# Patient Record
Sex: Female | Born: 1945 | Race: White | Hispanic: No | Marital: Married | Smoking: Never smoker
Health system: Southern US, Community
[De-identification: ages and names within clinical notes are randomized; demographics above are authoritative.]

## PROBLEM LIST (undated history)

## (undated) DIAGNOSIS — I771 Stricture of artery: Secondary | ICD-10-CM

## (undated) DIAGNOSIS — E079 Disorder of thyroid, unspecified: Secondary | ICD-10-CM

## (undated) HISTORY — DX: Stricture of artery: I77.1

## (undated) HISTORY — PX: LAPAROTOMY: SHX154

## (undated) HISTORY — PX: FOOT SURGERY: SHX648

---

## 1999-07-11 ENCOUNTER — Other Ambulatory Visit: Admission: RE | Admit: 1999-07-11 | Discharge: 1999-07-11 | Payer: Self-pay | Admitting: Family Medicine

## 2000-08-16 ENCOUNTER — Other Ambulatory Visit: Admission: RE | Admit: 2000-08-16 | Discharge: 2000-08-16 | Payer: Self-pay | Admitting: Family Medicine

## 2000-12-28 ENCOUNTER — Ambulatory Visit (HOSPITAL_COMMUNITY): Admission: RE | Admit: 2000-12-28 | Discharge: 2000-12-28 | Payer: Self-pay | Admitting: Gastroenterology

## 2000-12-28 ENCOUNTER — Encounter (INDEPENDENT_AMBULATORY_CARE_PROVIDER_SITE_OTHER): Payer: Self-pay | Admitting: Specialist

## 2003-10-28 ENCOUNTER — Other Ambulatory Visit: Admission: RE | Admit: 2003-10-28 | Discharge: 2003-10-28 | Payer: Self-pay | Admitting: Family Medicine

## 2004-10-28 ENCOUNTER — Other Ambulatory Visit: Admission: RE | Admit: 2004-10-28 | Discharge: 2004-10-28 | Payer: Self-pay | Admitting: Family Medicine

## 2005-07-16 ENCOUNTER — Emergency Department (HOSPITAL_COMMUNITY): Admission: EM | Admit: 2005-07-16 | Discharge: 2005-07-16 | Payer: Self-pay | Admitting: Emergency Medicine

## 2005-10-20 ENCOUNTER — Encounter: Payer: Self-pay | Admitting: Family Medicine

## 2006-01-23 ENCOUNTER — Other Ambulatory Visit: Admission: RE | Admit: 2006-01-23 | Discharge: 2006-01-23 | Payer: Self-pay | Admitting: Family Medicine

## 2007-01-21 LAB — CONVERTED CEMR LAB
Pap Smear: NORMAL
Pap Smear: NORMAL
Pap Smear: NORMAL

## 2007-02-07 ENCOUNTER — Other Ambulatory Visit: Admission: RE | Admit: 2007-02-07 | Discharge: 2007-02-07 | Payer: Self-pay | Admitting: Family Medicine

## 2007-12-19 ENCOUNTER — Ambulatory Visit: Payer: Self-pay | Admitting: Family Medicine

## 2007-12-19 DIAGNOSIS — M51379 Other intervertebral disc degeneration, lumbosacral region without mention of lumbar back pain or lower extremity pain: Secondary | ICD-10-CM | POA: Insufficient documentation

## 2007-12-19 DIAGNOSIS — E7800 Pure hypercholesterolemia, unspecified: Secondary | ICD-10-CM | POA: Insufficient documentation

## 2007-12-19 DIAGNOSIS — R32 Unspecified urinary incontinence: Secondary | ICD-10-CM

## 2007-12-19 DIAGNOSIS — H1045 Other chronic allergic conjunctivitis: Secondary | ICD-10-CM | POA: Insufficient documentation

## 2007-12-19 DIAGNOSIS — M5430 Sciatica, unspecified side: Secondary | ICD-10-CM | POA: Insufficient documentation

## 2007-12-19 DIAGNOSIS — E039 Hypothyroidism, unspecified: Secondary | ICD-10-CM | POA: Insufficient documentation

## 2007-12-19 DIAGNOSIS — Z8719 Personal history of other diseases of the digestive system: Secondary | ICD-10-CM | POA: Insufficient documentation

## 2007-12-19 DIAGNOSIS — M810 Age-related osteoporosis without current pathological fracture: Secondary | ICD-10-CM | POA: Insufficient documentation

## 2007-12-19 DIAGNOSIS — D649 Anemia, unspecified: Secondary | ICD-10-CM

## 2007-12-19 DIAGNOSIS — J309 Allergic rhinitis, unspecified: Secondary | ICD-10-CM | POA: Insufficient documentation

## 2007-12-19 DIAGNOSIS — M1990 Unspecified osteoarthritis, unspecified site: Secondary | ICD-10-CM | POA: Insufficient documentation

## 2007-12-19 DIAGNOSIS — N809 Endometriosis, unspecified: Secondary | ICD-10-CM | POA: Insufficient documentation

## 2007-12-19 DIAGNOSIS — K219 Gastro-esophageal reflux disease without esophagitis: Secondary | ICD-10-CM | POA: Insufficient documentation

## 2007-12-19 DIAGNOSIS — R635 Abnormal weight gain: Secondary | ICD-10-CM | POA: Insufficient documentation

## 2007-12-19 DIAGNOSIS — G43809 Other migraine, not intractable, without status migrainosus: Secondary | ICD-10-CM | POA: Insufficient documentation

## 2007-12-19 DIAGNOSIS — I951 Orthostatic hypotension: Secondary | ICD-10-CM | POA: Insufficient documentation

## 2007-12-19 DIAGNOSIS — Q401 Congenital hiatus hernia: Secondary | ICD-10-CM | POA: Insufficient documentation

## 2007-12-19 DIAGNOSIS — M5137 Other intervertebral disc degeneration, lumbosacral region: Secondary | ICD-10-CM

## 2008-02-19 ENCOUNTER — Ambulatory Visit: Payer: Self-pay | Admitting: Family Medicine

## 2008-02-19 ENCOUNTER — Telehealth: Payer: Self-pay | Admitting: Family Medicine

## 2008-02-19 DIAGNOSIS — Z978 Presence of other specified devices: Secondary | ICD-10-CM

## 2008-02-20 LAB — CONVERTED CEMR LAB
AST: 32 units/L (ref 0–37)
Albumin: 4.1 g/dL (ref 3.5–5.2)
BUN: 13 mg/dL (ref 6–23)
Basophils Absolute: 0 10*3/uL (ref 0.0–0.1)
Basophils Relative: 0 % (ref 0.0–3.0)
Calcium: 9.4 mg/dL (ref 8.4–10.5)
Chloride: 109 meq/L (ref 96–112)
Cholesterol: 170 mg/dL (ref 0–200)
Creatinine, Ser: 1 mg/dL (ref 0.4–1.2)
Eosinophils Absolute: 0.1 10*3/uL (ref 0.0–0.7)
Eosinophils Relative: 0.9 % (ref 0.0–5.0)
Ferritin: 9.6 ng/mL — ABNORMAL LOW (ref 10.0–291.0)
Free T4: 0.8 ng/dL (ref 0.6–1.6)
GFR calc non Af Amer: 60 mL/min
HCT: 38.6 % (ref 36.0–46.0)
Hemoglobin: 13.1 g/dL (ref 12.0–15.0)
Iron: 132 ug/dL (ref 42–145)
MCHC: 33.9 g/dL (ref 30.0–36.0)
MCV: 93.7 fL (ref 78.0–100.0)
Monocytes Absolute: 0.4 10*3/uL (ref 0.1–1.0)
Neutro Abs: 3.6 10*3/uL (ref 1.4–7.7)
RBC: 4.12 M/uL (ref 3.87–5.11)
Total Bilirubin: 0.8 mg/dL (ref 0.3–1.2)
Transferrin: 284.3 mg/dL (ref >=212.0)
VLDL: 13 mg/dL (ref 0–40)
WBC: 6.2 10*3/uL (ref 4.5–10.5)

## 2008-03-04 ENCOUNTER — Encounter: Payer: Self-pay | Admitting: Family Medicine

## 2008-03-04 ENCOUNTER — Ambulatory Visit: Payer: Self-pay | Admitting: Family Medicine

## 2008-03-04 ENCOUNTER — Other Ambulatory Visit: Admission: RE | Admit: 2008-03-04 | Discharge: 2008-03-04 | Payer: Self-pay | Admitting: Family Medicine

## 2008-03-04 DIAGNOSIS — F329 Major depressive disorder, single episode, unspecified: Secondary | ICD-10-CM

## 2008-03-06 ENCOUNTER — Encounter (INDEPENDENT_AMBULATORY_CARE_PROVIDER_SITE_OTHER): Payer: Self-pay | Admitting: *Deleted

## 2008-03-13 ENCOUNTER — Encounter: Payer: Self-pay | Admitting: Family Medicine

## 2008-04-06 ENCOUNTER — Telehealth: Payer: Self-pay | Admitting: Family Medicine

## 2008-06-25 ENCOUNTER — Ambulatory Visit: Payer: Self-pay | Admitting: Family Medicine

## 2008-06-25 DIAGNOSIS — M79609 Pain in unspecified limb: Secondary | ICD-10-CM

## 2008-06-25 DIAGNOSIS — M2140 Flat foot [pes planus] (acquired), unspecified foot: Secondary | ICD-10-CM | POA: Insufficient documentation

## 2008-07-16 ENCOUNTER — Ambulatory Visit: Payer: Self-pay | Admitting: Family Medicine

## 2008-07-21 LAB — CONVERTED CEMR LAB: Vit D, 25-Hydroxy: 37 ng/mL (ref 30–89)

## 2008-07-31 ENCOUNTER — Ambulatory Visit: Payer: Self-pay | Admitting: Family Medicine

## 2008-07-31 DIAGNOSIS — Z87312 Personal history of (healed) stress fracture: Secondary | ICD-10-CM

## 2008-07-31 DIAGNOSIS — M7750 Other enthesopathy of unspecified foot: Secondary | ICD-10-CM | POA: Insufficient documentation

## 2008-08-03 ENCOUNTER — Ambulatory Visit: Payer: Self-pay | Admitting: Family Medicine

## 2008-08-04 ENCOUNTER — Encounter: Payer: Self-pay | Admitting: Family Medicine

## 2008-09-28 ENCOUNTER — Ambulatory Visit: Payer: Self-pay | Admitting: Family Medicine

## 2009-03-15 ENCOUNTER — Encounter: Payer: Self-pay | Admitting: Family Medicine

## 2009-04-14 ENCOUNTER — Telehealth: Payer: Self-pay | Admitting: Family Medicine

## 2009-06-29 ENCOUNTER — Ambulatory Visit: Payer: Self-pay | Admitting: Family Medicine

## 2009-06-29 DIAGNOSIS — M6530 Trigger finger, unspecified finger: Secondary | ICD-10-CM | POA: Insufficient documentation

## 2009-08-03 ENCOUNTER — Ambulatory Visit: Payer: Self-pay | Admitting: Family Medicine

## 2009-08-03 ENCOUNTER — Other Ambulatory Visit: Admission: RE | Admit: 2009-08-03 | Discharge: 2009-08-03 | Payer: Self-pay | Admitting: Family Medicine

## 2009-08-12 LAB — CONVERTED CEMR LAB: Pap Smear: NEGATIVE

## 2009-08-13 ENCOUNTER — Encounter (INDEPENDENT_AMBULATORY_CARE_PROVIDER_SITE_OTHER): Payer: Self-pay | Admitting: *Deleted

## 2009-08-25 ENCOUNTER — Encounter: Payer: Self-pay | Admitting: Family Medicine

## 2009-09-16 ENCOUNTER — Encounter: Payer: Self-pay | Admitting: Family Medicine

## 2009-12-14 ENCOUNTER — Encounter: Payer: Self-pay | Admitting: Family Medicine

## 2010-03-20 LAB — CONVERTED CEMR LAB
ALT: 19 units/L (ref 0–35)
Basophils Relative: 0 % (ref 0.0–3.0)
Bilirubin, Direct: 0.1 mg/dL (ref 0.0–0.3)
Chloride: 107 meq/L (ref 96–112)
Cholesterol: 155 mg/dL (ref 0–200)
Cyclic Citrullin Peptide Ab: <2 units (ref 0–5)
Eosinophils Relative: 0 % (ref 0.0–5.0)
GFR calc non Af Amer: 78.98 mL/min (ref >60)
HCT: 36.7 % (ref 36.0–46.0)
HDL: 62.2 mg/dL (ref >39.00)
Hemoglobin: 12.6 g/dL (ref 12.0–15.0)
LDL Cholesterol: 84 mg/dL (ref 0–99)
Lymphs Abs: 1.9 10*3/uL (ref 0.7–4.0)
MCV: 90.2 fL (ref 78.0–100.0)
Monocytes Absolute: 0.4 10*3/uL (ref 0.1–1.0)
Monocytes Relative: 8.2 % (ref 3.0–12.0)
Neutro Abs: 3.1 10*3/uL (ref 1.4–7.7)
Potassium: 4.4 meq/L (ref 3.5–5.1)
RBC: 4.07 M/uL (ref 3.87–5.11)
Sed Rate: 17 mm/hr (ref 0–22)
Sodium: 146 meq/L — ABNORMAL HIGH (ref 135–145)
TSH: 1.34 microintl units/mL (ref 0.35–5.50)
Total Bilirubin: 0.4 mg/dL (ref 0.3–1.2)
Total Protein: 7 g/dL (ref 6.0–8.3)
VLDL: 8.6 mg/dL (ref 0.0–40.0)
WBC: 5.4 10*3/uL (ref 4.5–10.5)

## 2010-03-22 NOTE — Assessment & Plan Note (Signed)
Summary: ? ARTHRITIS IN HANDS   Vital Signs:  Patient profile:   65 year old female Height:      63 inches Weight:      127.8 pounds BMI:     22.72 Temp:     98.0 degrees F oral Pulse rate:   68 / minute Pulse rhythm:   regular BP sitting:   120 / 82  (left arm) Cuff size:   regular  Vitals Entered By: Benny Lennert CMA Duncan Dull) (Jun 29, 2009 8:07 AM)  History of Present Illness: Chief complaint arthritis in hands  In past 2 month fairly sudden onset on hand stiffness and ain...began in right hand.  Mainly pain in thumbs. Frequently thumbs get stuck benyt.  Some swelling in MCPs, PIP.Marland Kitchenocc redness.  Awakes with stiffness in hands...takes about 1 hour to work out stiffness. No other joint issues.  Some fatigue. No new rash. Abdominal pain intermittant , diarrhea and constipation...diagnosed years ago ewith IBS.  No family history of RA or auto immune disease.  Not using medicaiton...had tried aleve inpast..minimal help.   Has history of arthritis in low back severe.   Problems Prior to Update: 1)  Pain in Soft Tissues of Limb  (ICD-729.5) 2)  Fungal Dermatitis  (ICD-111.9) 3)  Cellulitis, Face  (ICD-682.0) 4)  Stress Fracture of The Metatarsals  (ICD-733.94) 5)  Personal History of Stress Fracture  (ICD-V13.52) 6)  Metatarsalgia  (ICD-726.70) 7)  Pes Planus  (ICD-734) 8)  Foot Pain, Chronic  (ICD-729.5) 9)  Leg Pain, Right  (ICD-729.5) 10)  Routine Gynecological Examination  (ICD-V72.31) 11)  Well Woman  (ICD-V70.0) 12)  Unspecified Hypothyroidism  (ICD-244.9) 13)  Skin Rash  (ICD-782.1) 14)  Depression  (ICD-311) 15)  Breast Implants, Bilateral, Hx of  (ICD-V43.82) 16)  Irritable Bowel Syndrome, Hx of  (ICD-V12.79) 17)  Positive Tb Skin Test, Without Tuberculosis  (ICD-795.5) 18)  Hypercholesterolemia  (ICD-272.0) 19)  Weight Gain, Abnormal  (ICD-783.1) 20)  Osteoporosis  (ICD-733.00) 21)  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 Take 1 Tablet By Mouth Once A Day 2)  Elestat 0.05 % Soln (Epinastine Hcl) .... One Drop Each Eye 1 or 2 Times Per Day As Needed 3)  Cyproheptadine Hcl 4 Mg Tabs (Cyproheptadine Hcl) .... Take 1/2 To 2 Tablet By Mouth At Bedtime 4)  Aspirin 81 Mg  Tabs (Aspirin) .... Take 1 Tablet By Mouth Once A Day 5)  Calcium Carbonate-Vitamin D 600-400 Mg-Unit  Tabs (Calcium Carbonate-Vitamin D) .... Take 1 Tablet By Mouth Once A Day 6)  Fish Oil   Oil (Fish Oil) .... Take 1 Tablet By Mouth Once A Day 7)  Multivitamins   Tabs (Multiple Vitamin) .... Take 1 Tablet By Mouth Once A Day 8)  Alendronate Sodium 70 Mg Tabs (Alendronate Sodium) .Marland Kitchen.. 1 Tab By Mouth Weekly 9)  Cinnamon 500 Mg Tabs (Cinnamon) .... Take 1 Tablet By Mouth Once A Day 10)  Effexor Xr 37.5 Mg Xr24h-Cap (Venlafaxine Hcl) .... Take 1 Tablet  By Mouth Once A Day, Increase To 2 Tablets After 1 Week 11)  Triamcinolone Acetonide 0.1 % Crea :compounded Eucerin 1:1 .... Apply To Affected Area Two Times A Day X 2 Weeks  Allergies: 1)  ! Codeine 2)  ! Demerol  Past History:  Past medical, surgical, family and social histories (including risk factors) reviewed, and no changes noted (except as noted below).  Past Medical History: Reviewed history from 09/28/2008 and no changes required. Anemia-NOS Osteoarthritis Allergic rhinitis Urinary incontinence GERD Multiple fractures in past, feet Medial tibial stress fx,  2010  Past Surgical History: Reviewed history from 12/19/2007 and no changes required. foot surgery, B  fracture 1995 endometriosis, exp lap 1970s Tonsillectomy  Family History: Reviewed history from 12/19/2007 and no changes required. father: DDD, alzheimer's mother: arhtritis, familial polyposis, pancreativc cancer brother: arthritis MGF: colon cancer, familial polyposis, PE/DVT PGM: stomach cancer  Social History: Reviewed history from 12/19/2007 and no changes required. 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 ENT:  Complains of nasal congestion and postnasal drainage; allergies. CV:  Denies chest pain or discomfort. Resp:  Denies shortness of breath. GI:  Complains of abdominal pain, constipation, and diarrhea. GU:  Denies dysuria.  Physical Exam  General:  Well-developed,well-nourished,in no acute distress; alert,appropriate and cooperative throughout examination Nose:  nasal dischargemucosal pallor.   Mouth:  Oral mucosa and oropharynx without lesions or exudates.  Teeth in good repair. Neck:  no carotid bruit or thyromegaly no cervical or supraclavicular lymphadenopathy  Lungs:  Normal respiratory effort, chest expands symmetrically. Lungs are clear to auscultation, no crackles or wheezes. Heart:  Normal rate and regular rhythm. S1 and S2 normal without gallop, murmur, click, rub or other extra sounds. Msk:  B hands with deformity in MCP, PIPO and DIP synovitis in MCP joints, mild redness decrease grip strength due to stiffness right thumb triggering Pulses:  R and L posterior tibial pulses are full and equal bilaterally  Extremities:  no edmea  Skin:  Intact without suspicious lesions or rashes   Impression & Recommendations:  Problem # 1:  HAND PAIN, BILATERAL  (ICD-729.5) Concerning for RA as well as possible additional underlying OA.  Will eval with labs..hand X-rays to eval for erosions. Use aleve as needed pain...will consider other medicaiton depending on results. MAy need rheum referral.  Orders: Radiology other (Radiology Other) TLB-CBC Platelet - w/Differential (85025-CBCD) TLB-CRP-High Sensitivity (C-Reactive Protein) (86140-FCRP) TLB-Rheumatoid Factor (RA) ([address]-UE) TLB-Sedimentation Rate (ESR) (85652-ESR) T- * Misc. Laboratory test [phone])  Problem # 2:  TRIGGER FINGER, THUMB (ICD-727.03) Consider referral to hand specialist for steroid injection. Pt opento referral  but wishes to await study results first.   Problem # 3:  HYPERCHOLESTEROLEMIA (ICD-272.0) Due for reeval.  Orders: TLB-Lipid Panel (80061-LIPID) TLB-Hepatic/Liver Function Pnl (80076-HEPATIC) TLB-BMP (Basic Metabolic Panel-BMET) (80048-METABOL)  Problem # 4:  UNSPECIFIED HYPOTHYROIDISM (ICD-244.9) Due for reeval.  Her updated medication list for this problem includes:    Synthroid 100 Mcg Tabs (Levothyroxine sodium) .Marland Kitchen... Take 1 tablet by mouth once a day  Orders: TLB-TSH (Thyroid Stimulating Hormone) (84443-TSH)  Complete Medication List: 1)  Synthroid 100 Mcg Tabs (Levothyroxine sodium) .... Take 1 tablet by mouth once a day 2)  Elestat 0.05 % Soln (Epinastine hcl) .... One drop each eye 1 or 2 times per day as needed 3)  Cyproheptadine Hcl 4 Mg Tabs (Cyproheptadine hcl) .Marland KitchenMarland KitchenMarland Kitchen  Take 1/2 to 2 tablet by mouth at bedtime 4)  Aspirin 81 Mg Tabs (Aspirin) .... Take 1 tablet by mouth once a day 5)  Calcium Carbonate-vitamin D 600-400 Mg-unit Tabs (Calcium carbonate-vitamin d) .... Take 1 tablet by mouth once a day 6)  Fish Oil Oil (Fish oil) .... Take 1 tablet by mouth once a day 7)  Multivitamins Tabs (Multiple vitamin) .... Take 1 tablet by mouth once a day 8)  Alendronate Sodium 70 Mg Tabs (Alendronate sodium) .Marland Kitchen.. 1 tab by mouth weekly 9)  Cinnamon 500 Mg Tabs  (Cinnamon) .... Take 1 tablet by mouth once a day 10)  Effexor Xr 37.5 Mg Xr24h-cap (Venlafaxine hcl) .... Take 1 tablet by mouth once a day, increase to 2 tablets after 1 week 11)  Triamcinolone Acetonide 0.1 % Crea :compounded Eucerin 1:1  .... Apply to affected area two times a day x 2 weeks  Patient Instructions: 1)  Schedule CPX in next few months please.  2)  We will call with X-ray results. 3)     Current Allergies (reviewed today): ! CODEINE ! DEMEROL

## 2010-03-22 NOTE — Assessment & Plan Note (Signed)
Summary: CPX/RBH   Vital Signs:  Patient profile:   65 year old female Height:      63 inches Weight:      130.4 pounds BMI:     23.18 Temp:     98.1 degrees F oral Pulse rate:   68 / minute Pulse rhythm:   regular BP sitting:   110 / 70  (left arm) Cuff size:   regular  Vitals Entered By: Benny Lennert CMA Duncan Dull) (August 03, 2009 8:37 AM)  History of Present Illness: Chief complaint cpx  B hand stiffness and pain: Mildly elevated RF, nml antiCCP antibody. Nml sed rate. Continues with hand stiffness, fairly severe. Using naproxen as needed...helps some, using 2 times a week.  Intermittant low back pain. no othe joint involvement.  MGM: stomach canvcer  PGF: colon cancer  She is due for colonoscopy in 2012..consider endo then for screening... refer to Gi then.   Problems Prior to Update: 1)  Trigger Finger, Thumb  (ICD-727.03) 2)  Hand Pain, Bilateral  (ICD-729.5) 3)  Pain in Soft Tissues of Limb  (ICD-729.5) 4)  Fungal Dermatitis  (ICD-111.9) 5)  Cellulitis, Face  (ICD-682.0) 6)  Stress Fracture of The Metatarsals  (ICD-733.94) 7)  Personal History of Stress Fracture  (ICD-V13.52) 8)  Metatarsalgia  (ICD-726.70) 9)  Pes Planus  (ICD-734) 10)  Foot Pain, Chronic  (ICD-729.5) 11)  Leg Pain, Right  (ICD-729.5) 12)  Routine Gynecological Examination  (ICD-V72.31) 13)  Well Woman  (ICD-V70.0) 14)  Unspecified Hypothyroidism  (ICD-244.9) 15)  Skin Rash  (ICD-782.1) 16)  Depression  (ICD-311) 17)  Breast Implants, Bilateral, Hx of  (ICD-V43.82) 18)  Irritable Bowel Syndrome, Hx of  (ICD-V12.79) 19)  Positive Tb Skin Test, Without Tuberculosis  (ICD-795.5) 20)  Hypercholesterolemia  (ICD-272.0) 21)  Weight Gain, Abnormal  (ICD-783.1) 22)  Osteoporosis  (ICD-733.00) 23)  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 Take 1 Tablet By Mouth Once A Day 2)  Elestat 0.05 % Soln (Epinastine Hcl) .... One Drop Each Eye 1 or 2 Times Per Day As Needed 3)  Cyproheptadine Hcl 4 Mg Tabs (Cyproheptadine Hcl) .... Take 1/2 To 2 Tablet By Mouth At Bedtime 4)  Aspirin 81 Mg  Tabs (Aspirin) .... Take 1 Tablet By Mouth Once A Day 5)  Calcium Carbonate-Vitamin D 600-400 Mg-Unit  Tabs (Calcium Carbonate-Vitamin D) .... Take 1 Tablet By Mouth Once A Day 6)  Fish Oil   Oil (Fish Oil) .... Take 1 Tablet By Mouth Once A Day 7)  Multivitamins   Tabs (Multiple Vitamin) .... Take 1 Tablet By Mouth Once A Day 8)  Alendronate Sodium 70 Mg Tabs (Alendronate Sodium) .Marland Kitchen.. 1 Tab By Mouth Weekly 9)  Cinnamon 500 Mg Tabs (Cinnamon) .... Take 1 Tablet By Mouth Once A Day 10)  Effexor Xr 37.5 Mg Xr24h-Cap (Venlafaxine Hcl) .... Take 1 Tablet By Mouth Once A Day, Increase To 2 Tablets After 1 Week 11)  Triamcinolone Acetonide 0.1 % Crea :compounded Eucerin 1:1 .Marland KitchenMarland KitchenMarland Kitchen  Apply To Affected Area Two Times A Day X 2 Weeks 12)  Vimovo 500-20 Mg Tbec (Naproxen-Esomeprazole) .Marland Kitchen.. 1 Tab By Mouth Two Times A Day As Needed Pain  Allergies: 1)  ! Codeine 2)  ! Demerol  Past History:  Past medical, surgical, family and social histories (including risk factors) reviewed, and no changes noted (except as noted below).  Past Medical History: Reviewed history from 09/28/2008 and no changes required. Anemia-NOS Osteoarthritis Allergic rhinitis Urinary incontinence GERD Multiple fractures in past, feet Medial tibial stress fx, 2010  Past Surgical  History: Reviewed history from 12/19/2007 and no changes required. foot surgery, B  fracture 1995 endometriosis, exp lap 1970s Tonsillectomy  Family History: Reviewed history from 12/19/2007 and no changes required. father: DDD, alzheimer's mother: arhtritis, familial polyposis, pancreativc cancer brother: arthritis MGF: colon cancer, familial polyposis, PE/DVT PGM: stomach cancer  Social History: Reviewed history from 12/19/2007 and no changes required. 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 ENT:  Complains of nasal congestion; Using allergy medication at night.. CV:  Denies chest pain or discomfort. Resp:  Denies shortness of breath, sputum productive, and wheezing. GI:  Denies abdominal pain, bloody stools, constipation, and diarrhea. GU:  Denies abnormal vaginal bleeding and dysuria. Derm:  Complains of lesion(s); several lesion on arms B. . Psych:  Denies anxiety and depression.  Physical Exam  General:  Well-developed,well-nourished,in no acute distress; alert,appropriate and cooperative throughout examination Head:  Normocephalic and atraumatic without obvious abnormalities. No apparent alopecia or balding. Eyes:  No corneal or conjunctival inflammation noted. EOMI. Perrla. Funduscopic exam benign, without hemorrhages, exudates or papilledema. Vision grossly normal. Ears:  External ear exam shows no significant lesions or deformities.  Otoscopic examination reveals clear canals, tympanic membranes are intact bilaterally without bulging, retraction, inflammation or discharge. Hearing is grossly normal bilaterally. Nose:  nasal dischargemucosal pallor.   Mouth:  Oral mucosa and oropharynx without lesions or exudates.  Teeth in good repair. Neck:  no carotid bruit or thyromegaly no cervical  or supraclavicular lymphadenopathy  Chest Wall:  No deformities, masses, or tenderness noted. Breasts:  normal L breast, implants, no axillary LAD, some prominent band like tissue in axilla mildly tender to palpation, no focal lumps Lungs:  Normal respiratory effort, chest expands symmetrically. Lungs are clear to auscultation, no crackles or wheezes. Heart:  Normal rate and regular rhythm. S1 and S2 normal without gallop, murmur, click, rub or other extra sounds. Abdomen:  Bowel sounds positive,abdomen soft and non-tender without masses, organomegaly or hernias noted. Genitalia:  Pelvic Exam:        External: normal female genitalia without lesions or masses        Vagina: normal without lesions or masses        Cervix: normal without lesions or masses        Adnexa: normal bimanual exam without masses or fullness        Uterus: normal by palpation        Pap smear: performed Msk:  B hands with deformity in MCP, PIPO and DIP synovitis in MCP joints, mild redness decrease grip strength due to stiffness right thumb triggering Pulses:  R and L posterior tibial pulses are full and equal bilaterally  Extremities:  no edema Neurologic:  No cranial nerve deficits noted. Station and gait are normal. Plantar reflexes are down-going bilaterally. DTRs are symmetrical  throughout. Sensory, motor and coordinative functions appear intact. Skin:  3 mm raised fleshy colored flacky, wart like lesion left upper arm Cervical Nodes:  No lymphadenopathy noted Axillary Nodes:  No palpable lymphadenopathy Inguinal Nodes:  No significant adenopathy Psych:  Cognition and judgment appear intact. Alert and cooperative with normal attention span and concentration. No apparent delusions, illusions, hallucinations   Impression & Recommendations:  Problem # 1:  WELL WOMAN (ICD-V70.0) The patient's preventative maintenance and recommended screening tests for an annual wellness exam were reviewed in full  today. Brought up to date unless services declined.  Counselled on the importance of diet, exercise, and its role in overall health and mortality. The patient's FH and SH was reviewed, including their home life, tobacco status, and drug and alcohol status.     Problem # 2:  ROUTINE GYNECOLOGICAL EXAMINATION (ICD-V72.31) PAP pending   Problem # 3:  HAND PAIN, BILATERAL (ICD-729.5) Equivical lab testing and negative hand films. Given pt concern and appearance of ? synovitis and deformity in MCP joints will refer to rheum for further eval. Treat with  naproxen but add prilosec to protect stomach....prescrimed vimovo.  Orders: Rheumatology Referral (Rheumatology)  Problem # 4:  Family Hx of POLYPOSIS, FAMILIAL ADENOMATOUS (ICD-211.3) Will be due next year for colonoscopy.Marland KitchenMarland KitchenI am unclear as to recs for any stomach cancer screening..per pt she was told to have ENDO frequently as well to eval for this. Occ heartburn and upset stomach, no blood in stool seen. Refer to GI early next year..(flag sent to myself for 01/2010)  Problem # 5:  UNSPECIFIED HYPOTHYROIDISM (ICD-244.9) Well controlled. Continue current medication.  Her updated medication list for this problem includes:    Synthroid 100 Mcg Tabs (Levothyroxine sodium) .Marland Kitchen... Take 1 tablet by mouth once a day  Problem # 6:  ANEMIA-NOS (ICD-285.9)  Nml cbc .   Complete Medication List: 1)  Synthroid 100 Mcg Tabs (Levothyroxine sodium) .... Take 1 tablet by mouth once a day 2)  Elestat 0.05 % Soln (Epinastine hcl) .... One drop each eye 1 or 2 times per day as needed 3)  Cyproheptadine Hcl 4 Mg Tabs (Cyproheptadine hcl) .... Take 1/2 to 2 tablet by mouth at bedtime 4)  Aspirin 81 Mg Tabs (Aspirin) .... Take 1 tablet by mouth once a day 5)  Calcium Carbonate-vitamin D 600-400 Mg-unit Tabs (Calcium carbonate-vitamin d) .... Take 1 tablet by mouth once a day 6)  Fish Oil Oil (Fish oil) .... Take 1 tablet by mouth once a day 7)  Multivitamins  Tabs (Multiple vitamin) .... Take 1 tablet by mouth once a day 8)  Alendronate Sodium 70 Mg Tabs (Alendronate sodium) .Marland Kitchen.. 1 tab by mouth weekly 9)  Cinnamon 500 Mg Tabs (Cinnamon) .... Take 1 tablet by mouth once a day 10)  Effexor Xr 37.5 Mg Xr24h-cap (Venlafaxine hcl) .... Take 1 tablet by mouth once a day, increase to 2 tablets after 1 week 11)  Triamcinolone Acetonide 0.1 % Crea :compounded Eucerin 1:1  .... Apply to affected area two times a day x 2 weeks 12)  Vimovo 500-20 Mg Tbec (Naproxen-esomeprazole) .Marland Kitchen.. 1 tab by mouth two times a day as needed pain  Patient Instructions: 1)  Referral Appointment Information 2)  Day/Date: 3)  Time: 4)  Place/MD: 5)  Address: 6)  Phone/Fax: 7)  Patient given appointment information. Information/Orders faxed/mailed.  8)   B12 1000 micrograms daily for allergies and energy. 9)  Call to seet up RN visit for shingles once we have  it in 1-2 months. Prescriptions: VIMOVO 500-20 MG TBEC (NAPROXEN-ESOMEPRAZOLE) 1 tab by mouth two times a day as needed pain  #60 x 3   Entered and Authorized by:   Kerby Nora MD   Signed by:   Kerby Nora MD on 08/03/2009   Method used:   Electronically to        [facility]  [address] [id number] ([facility]       [address]       [place], [place]  [address]       Ph: [phone] or [phone]       Fax: [phone]   RxID:   [id number]   Current Allergies (reviewed today): ! CODEINE ! DEMEROL     Past Medical History:    Reviewed history from 09/28/2008 and no changes required:       Anemia-NOS       Osteoarthritis       Allergic rhinitis       Urinary incontinence       GERD       Multiple fractures in past, feet       Medial tibial stress fx, 2010  Past Surgical History:    Reviewed history from 12/19/2007 and no changes required:       foot surgery, B  fracture 1995       endometriosis, exp lap 1970s       Tonsillectomy

## 2010-03-22 NOTE — Letter (Signed)
Summary: [facility]   Imported By: Lanelle Bal 09/28/2009 09:02:46  _____________________________________________________________________  External Attachment:    Type:   Image     Comment:   External Document

## 2010-03-22 NOTE — Letter (Signed)
Summary: [facility]   Imported By: Maryln Gottron 12/27/2009 13:42:56  _____________________________________________________________________  External Attachment:    Type:   Image     Comment:   External Document

## 2010-03-22 NOTE — Progress Notes (Signed)
Summary: Rx Effexor  Phone Note Refill Request Call back at [phone] Message from:  [facility] on April 14, 2009 3:53 PM  Refills Requested: Medication #1:  EFFEXOR XR 37.5 MG XR24H-CAP Take 1 tablet by mouth once a day Received refill request from [facility], please advise   Method Requested: Electronic Initial call taken by: Linde Gillis CMA Duncan Dull),  April 14, 2009 3:54 PM    Prescriptions: EFFEXOR XR 37.5 MG XR24H-CAP (VENLAFAXINE HCL) Take 1 tablet by mouth once a day, increase to 2 tablets after 1 week  #60 x 5   Entered and Authorized by:   Kerby Nora MD   Signed by:   Kerby Nora MD on 04/14/2009   Method used:   [facility] to        [facility]  [address] [id number]       [address]       [place], [place]  [address]       Ph: [phone] or [phone]       Fax: [phone]   RxID:   [id number]

## 2010-03-22 NOTE — Letter (Signed)
Summary: Results Follow up Letter  [place] [facility]  [address] [place], [place] [address]   Phone: [phone]  Fax: [phone]    08/13/2009 MRN: [id number]     [facility] [address] [place], [place]  [address]    Dear Ms. Schepers,  The following are the results of your recent test(s):  Test         Result    Pap Smear:        Normal __x___  Not Normal _____ Comments:Repeat in 1 year ______________________________________________________ Cholesterol: LDL(Bad cholesterol):         Your goal is less than:         HDL (Good cholesterol):       Your goal is more than: Comments:  ______________________________________________________ Mammogram:        Normal _____  Not Normal _____ Comments:  ___________________________________________________________________ Hemoccult:        Normal _____  Not normal _______ Comments:    _____________________________________________________________________ Other Tests:    We routinely do not discuss normal results over the telephone.  If you desire a copy of the results, or you have any questions about this information we can discuss them at your next office visit.   Sincerely,  Kerby Nora MD

## 2010-03-22 NOTE — Consult Note (Signed)
Summary: [facility]   Imported By: Lanelle Bal 09/13/2009 11:45:30  _____________________________________________________________________  External Attachment:    Type:   Image     Comment:   External Document

## 2010-05-27 ENCOUNTER — Other Ambulatory Visit: Payer: Self-pay | Admitting: Family Medicine

## 2010-06-14 ENCOUNTER — Other Ambulatory Visit: Payer: Self-pay | Admitting: Otolaryngology

## 2010-06-27 ENCOUNTER — Other Ambulatory Visit: Payer: Self-pay | Admitting: Gastroenterology

## 2010-07-08 NOTE — Procedures (Signed)
[facility]  Patient:    [name], HULET [facility] Visit Number: [id number] MRN: [id number]          Service Type: END Location: ENDO Attending Physician:  Nelda Marseille Proc. Date: 12/28/00 Admit Date:  12/28/2000 Discharge Date: 12/28/2000   CC:         Vikki Ports, M.D.   Procedure Report  PROCEDURE:  Colonoscopy with polypectomy.  INDICATION:  Family history of polyposis syndrome with a history of a single polyp on previous colonoscopy.  Consent was signed after risks, benefits methods and options thoroughly discussed in the office.  MEDICATIONS:  Demerol 50, Versed 6.  DESCRIPTION OF PROCEDURE:  Rectal inspection was pertinent for external hemorrhoids.  Digital exam was negative.  The pediatric adjustable video colonoscope was inserted and fairly easily advanced around the colon to the cecum despite a tortuous colon.  This did require rolling her on her back and some abdominal pressure.  No obvious abnormalities seen on insertion.  The cecum was identified by the appendiceal orifice and the ileocecal valve.  In fact, the scope was inserted a short ways into the terminal ileum. Unfortunately, we could not maintain position there but on quick evaluation, it appeared normal.  The scope was slowly withdrawn.  The prep was adequate. There was some liquid stool that required washing and suctioning.  In the ascending colon, two small sessile polyps were seen.  One was hot biopsied x 2 the other hot biopsied x 4.  Both were along folds.  The scope was slowly withdrawn.  The transverse, descending, and sigmoid was normal.  No diverticula or other polypoid lesions were seen.  Once back in the rectum, a tiny 1-2 mm polyp was seen, was hot biopsied and put in a separate container. The scope was then retroflexed, revealing some small internal hemorrhoids. The scope was straightened and readvanced a short ways around the left side of the colon.   Air was suctioned and the scope removed.  The patient tolerated the procedure well.  There was no obvious immediate complication.  ENDOSCOPIC DIAGNOSES: 1. Internal and external hemorrhoids. 2. Tortuous colon. 3. Tiny rectal polyp, hot biopsied. 4. Two ascending sessile small polyps, hot biopsied. 5. Otherwise within normal limits to the cecum and quick look at the end of    the terminal ileum.  PLAN:  Await pathology to determine future colonic screening.  Happy to see back p.r.n..  Otherwise return care to Dr. Theresia Lo for the customary health care maintenance to include yearly rectals and guaiacs.  We will put her on the customary seven day postpolypectomy instructions. Attending Physician:  Nelda Marseille DD:  12/28/00 TD:  12/31/00 Job: [phone] [id number]

## 2010-10-17 ENCOUNTER — Other Ambulatory Visit: Payer: Self-pay | Admitting: Family Medicine

## 2010-12-16 ENCOUNTER — Other Ambulatory Visit: Payer: Self-pay | Admitting: Family Medicine

## 2011-01-01 ENCOUNTER — Other Ambulatory Visit: Payer: Self-pay | Admitting: Family Medicine

## 2011-01-02 ENCOUNTER — Other Ambulatory Visit: Payer: Self-pay | Admitting: Family Medicine

## 2011-01-19 ENCOUNTER — Other Ambulatory Visit: Payer: Self-pay | Admitting: Dermatology

## 2011-03-24 ENCOUNTER — Other Ambulatory Visit: Payer: Self-pay | Admitting: Dermatology

## 2011-04-13 ENCOUNTER — Other Ambulatory Visit: Payer: Self-pay | Admitting: Family Medicine

## 2012-01-17 ENCOUNTER — Other Ambulatory Visit: Payer: Self-pay | Admitting: Internal Medicine

## 2012-01-17 DIAGNOSIS — IMO0002 Reserved for concepts with insufficient information to code with codable children: Secondary | ICD-10-CM

## 2012-02-05 ENCOUNTER — Ambulatory Visit
Admission: RE | Admit: 2012-02-05 | Discharge: 2012-02-05 | Disposition: A | Discharge status: Home / Self Care | Payer: BC Managed Care – PPO | Source: Ambulatory Visit | Attending: Internal Medicine | Admitting: Internal Medicine

## 2012-02-05 DIAGNOSIS — IMO0002 Reserved for concepts with insufficient information to code with codable children: Secondary | ICD-10-CM

## 2012-12-03 ENCOUNTER — Other Ambulatory Visit: Payer: Self-pay | Admitting: Physical Medicine and Rehabilitation

## 2012-12-03 DIAGNOSIS — M47817 Spondylosis without myelopathy or radiculopathy, lumbosacral region: Secondary | ICD-10-CM

## 2012-12-11 ENCOUNTER — Ambulatory Visit
Admission: RE | Admit: 2012-12-11 | Discharge: 2012-12-11 | Disposition: A | Discharge status: Home / Self Care | Payer: BC Managed Care – PPO | Source: Ambulatory Visit | Attending: Physical Medicine and Rehabilitation | Admitting: Physical Medicine and Rehabilitation

## 2012-12-11 DIAGNOSIS — M47817 Spondylosis without myelopathy or radiculopathy, lumbosacral region: Secondary | ICD-10-CM

## 2012-12-11 MED ORDER — GADOBENATE DIMEGLUMINE 529 MG/ML IV SOLN
11.0000 mL | Freq: Once | INTRAVENOUS | Status: AC | PRN
  Administered 2012-12-11: 11 mL via INTRAVENOUS

## 2013-07-31 DIAGNOSIS — M545 Low back pain, unspecified: Secondary | ICD-10-CM | POA: Insufficient documentation

## 2014-02-23 DIAGNOSIS — Z86010 Personal history of colonic polyps: Secondary | ICD-10-CM | POA: Insufficient documentation

## 2014-02-23 DIAGNOSIS — F329 Major depressive disorder, single episode, unspecified: Secondary | ICD-10-CM | POA: Insufficient documentation

## 2014-02-23 DIAGNOSIS — F419 Anxiety disorder, unspecified: Secondary | ICD-10-CM

## 2014-04-07 DIAGNOSIS — E039 Hypothyroidism, unspecified: Secondary | ICD-10-CM | POA: Insufficient documentation

## 2014-12-22 DEATH — deceased

## 2015-06-16 DIAGNOSIS — H04129 Dry eye syndrome of unspecified lacrimal gland: Secondary | ICD-10-CM | POA: Insufficient documentation

## 2015-06-16 DIAGNOSIS — M79676 Pain in unspecified toe(s): Secondary | ICD-10-CM | POA: Insufficient documentation

## 2015-07-13 ENCOUNTER — Ambulatory Visit: Payer: Medicare Other | Admitting: Podiatry

## 2015-07-27 ENCOUNTER — Encounter: Payer: Medicare Other | Admitting: Podiatry

## 2015-08-12 ENCOUNTER — Ambulatory Visit (INDEPENDENT_AMBULATORY_CARE_PROVIDER_SITE_OTHER): Payer: Medicare Other

## 2015-08-12 ENCOUNTER — Ambulatory Visit: Payer: Medicare Other

## 2015-08-12 ENCOUNTER — Encounter: Payer: Self-pay | Admitting: Podiatry

## 2015-08-12 ENCOUNTER — Ambulatory Visit (INDEPENDENT_AMBULATORY_CARE_PROVIDER_SITE_OTHER): Payer: Medicare Other | Admitting: Podiatry

## 2015-08-12 VITALS — BP 124/78 | HR 76 | Resp 16

## 2015-08-12 DIAGNOSIS — M79674 Pain in right toe(s): Secondary | ICD-10-CM

## 2015-08-12 DIAGNOSIS — B353 Tinea pedis: Secondary | ICD-10-CM

## 2015-08-12 DIAGNOSIS — M2031 Hallux varus (acquired), right foot: Secondary | ICD-10-CM | POA: Diagnosis not present

## 2015-08-12 DIAGNOSIS — M79675 Pain in left toe(s): Secondary | ICD-10-CM | POA: Diagnosis not present

## 2015-08-12 MED ORDER — NAFTIFINE HCL 1 % EX CREA
TOPICAL_CREAM | Freq: Every day | CUTANEOUS | Status: AC
Start: 2015-08-12 — End: ?

## 2015-08-12 NOTE — Progress Notes (Signed)
   Subjective:    Patient ID: Adriana MccallumGlenda Claudeen Amezcua, female    DOB: 06/02/1945, 70 y.o.   MRN: [id number]  HPI: She presents today with a chief complaint of cracking and peeling to the fifth digit of the right foot. She states that she does pick at this on occasion. She states that it cracks and hurts and it bleeds occasionally. She is also concerned about a change in the direction of her hallux. She states that she had surgery to her feet proximally 15 years ago and over the past 2-3 years she has developed the opposite of a bunion were her big toe sticks out. She denies any trauma to the foot.    Review of Systems  Musculoskeletal: Positive for arthralgias.  All other systems reviewed and are negative.      Objective:   Physical Exam: Vital signs are stable she is alert and oriented 3. Pulses are strongly palpable. Neurologic sensorium is intact. Deep tendon reflexes are intact. 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 Cutaneous evaluation does demonstrate tinea pedis with dry xerotic skin to the first and fifth digits of the right foot this very well could be an interdigital tinea pedis with eczema.        Assessment & Plan:    Assessment: Hallux varus and tinea pedis.  Plan: Started her on Naftin cream twice daily and discuss surgical options regarding her foot.

## 2015-08-30 ENCOUNTER — Other Ambulatory Visit: Payer: Self-pay | Admitting: Gastroenterology

## 2015-08-30 DIAGNOSIS — R1011 Right upper quadrant pain: Secondary | ICD-10-CM

## 2015-08-31 ENCOUNTER — Ambulatory Visit
Admission: RE | Admit: 2015-08-31 | Discharge: 2015-08-31 | Disposition: A | Discharge status: Home / Self Care | Payer: Medicare Other | Source: Ambulatory Visit | Attending: Gastroenterology | Admitting: Gastroenterology

## 2015-08-31 DIAGNOSIS — R1011 Right upper quadrant pain: Secondary | ICD-10-CM

## 2015-11-18 NOTE — Progress Notes (Signed)
This encounter was created in error - please disregard.

## 2016-09-15 ENCOUNTER — Encounter (INDEPENDENT_AMBULATORY_CARE_PROVIDER_SITE_OTHER): Payer: Medicare Other | Admitting: Ophthalmology

## 2017-01-01 ENCOUNTER — Encounter (INDEPENDENT_AMBULATORY_CARE_PROVIDER_SITE_OTHER): Payer: Medicare Other | Admitting: Ophthalmology

## 2017-01-01 DIAGNOSIS — H3509 Other intraretinal microvascular abnormalities: Secondary | ICD-10-CM | POA: Diagnosis not present

## 2017-01-01 DIAGNOSIS — H43813 Vitreous degeneration, bilateral: Secondary | ICD-10-CM | POA: Diagnosis not present

## 2017-02-22 ENCOUNTER — Encounter: Payer: Self-pay | Admitting: Neurology

## 2017-03-19 ENCOUNTER — Other Ambulatory Visit: Payer: Self-pay | Admitting: *Deleted

## 2017-03-19 DIAGNOSIS — R200 Anesthesia of skin: Secondary | ICD-10-CM

## 2017-03-20 ENCOUNTER — Ambulatory Visit (INDEPENDENT_AMBULATORY_CARE_PROVIDER_SITE_OTHER): Payer: Medicare Other | Admitting: Neurology

## 2017-03-20 DIAGNOSIS — G5620 Lesion of ulnar nerve, unspecified upper limb: Secondary | ICD-10-CM

## 2017-03-20 DIAGNOSIS — R200 Anesthesia of skin: Secondary | ICD-10-CM | POA: Diagnosis not present

## 2017-03-20 DIAGNOSIS — G5601 Carpal tunnel syndrome, right upper limb: Secondary | ICD-10-CM

## 2017-03-20 NOTE — Procedures (Signed)
[facility]  [address], [address]  [place], [place] [address] Tel: [phone] Fax:  [phone] Test Date:  03/20/2017  Patient: Pam BrownsGlenda Mathews DOB: 10/24/1945 Physician: Nita Sickleonika Orest Dygert, DO  Sex: Female Height: 5\' 1"  Ref Phys: Shanda BumpsSeth Jaffe, MD  ID#: [id number] Temp: 34.3C Technician:    Patient Complaints: This is a 72 year old female referred for evaluation of bilateral hand paresthesias.  NCV & EMG Findings: Extensive electrodiagnostic testing of the right upper extremity and additional studies of the left shows:  1. Bilateral median and ulnar sensory responses are within normal limits, however the right ulnar sensory response is asymmetrically reduced as compared to the left. Right mixed palmar sensory response shows absolute latency prolongation. 2. Bilateral median motor responses are within normal limits, however there is evidence of anomalous innervation to bilateral abductor pollicis brevis muscles, as noted by a motor response when stimulating at the ulnar wrist, consistent with a Martin-Gruber anastomosis. Bilateral ulnar motor responses show slowed conduction velocity across the elbow. 3. There is no evidence of active or chronic motor axon loss changes affecting any of the tested muscles. Motor unit configuration and recruitment pattern is within normal limits.  Impression: 1. Bilateral ulnar neuropathy with slowing across the elbow, predominantly demyelinating in type. Overall, these findings are mild in degree electrically and worse on the right. 2. Right median neuropathy at or distal to the wrist, consistent with clinical diagnosis of carpal tunnel syndrome. Overall, these findings are very mild in degree electrically. 3. Incidentally, there is a Martin-Gruber anastomosis bilaterally, a normal variant.   ___________________________ Nita Sickleonika Brighten Orndoff, DO    Nerve Conduction Studies Anti Sensory Summary Table   Site NR Peak (ms) Norm Peak (ms) P-T Amp (V) Norm  P-T Amp  Left Median Anti Sensory (2nd Digit)  34.3C  Wrist    3.4 <3.8 18.0 >10  Right Median Anti Sensory (2nd Digit)  34.3C  Wrist    3.2 <3.8 21.5 >10  Left Ulnar Anti Sensory (5th Digit)  34.3C  Wrist    3.1 <3.2 17.4 >5  Right Ulnar Anti Sensory (5th Digit)  34.3C  Wrist    2.8 <3.2 7.8 >5   Motor Summary Table   Site NR Onset (ms) Norm Onset (ms) O-P Amp (mV) Norm O-P Amp Site1 Site2 Delta-0 (ms) Dist (cm) Vel (m/s) Norm Vel (m/s)  Left Median Motor (Abd Poll Brev)  34.3C  Wrist    3.4 <4.0 6.9 >5 Elbow Wrist 5.7 29.0 51 >50  Elbow    9.1  6.5  Ulnar-wrist crossover Elbow 5.0 0.0    Ulnar-wrist crossover    4.1  5.2         Right Median Motor (Abd Poll Brev)  34.3C  Wrist    2.9 <4.0 5.9 >5 Elbow Wrist 5.4 27.0 50 >50  Elbow    8.3  5.3  Ulnar-wrist crossover Elbow 4.7 0.0    Ulnar-wrist crossover    3.6  4.1         Left Ulnar Motor (Abd Dig Minimi)  34.3C  Wrist    2.5 <3.1 8.5 >7 B Elbow Wrist 3.7 24.0 65 >50  B Elbow    6.2  8.0  A Elbow B Elbow 2.2 10.0 45 >50  A Elbow    8.4  7.6         Right Ulnar Motor (Abd Dig Minimi)  34.3C  Wrist    2.4 <3.1 9.2 >7 B Elbow Wrist 3.6 23.0 64 >50  B Elbow    6.0  9.1  A Elbow B Elbow 2.2 10.0 45 >50  A Elbow    8.2  8.4          Comparison Summary Table   Site NR Peak (ms) Norm Peak (ms) P-T Amp (V) Site1 Site2 Delta-P (ms) Norm Delta (ms)  Left Median/Ulnar Palm Comparison (Wrist - 8cm)  34.3C  Median Palm    1.9 <2.2 33.2 Median Palm Ulnar Palm 0.1   Ulnar Palm    2.0 <2.2 4.7      Right Median/Ulnar Palm Comparison (Wrist - 8cm)  34.3C  Median Palm    1.8 <2.2 36.9 Median Palm Ulnar Palm 0.5   Ulnar Palm    1.3 <2.2 12.5       EMG   Side Muscle Ins Act Fibs Psw Fasc Number Recrt Dur Dur. Amp Amp. Poly Poly. Comment  Right 1stDorInt Nml Nml Nml Nml Nml Nml Nml Nml Nml Nml Nml Nml N/A  Right Ext Indicis Nml Nml Nml Nml Nml Nml Nml Nml Nml Nml Nml Nml N/A  Right PronatorTeres Nml Nml Nml Nml Nml Nml Nml Nml Nml  Nml Nml Nml N/A  Right Biceps Nml Nml Nml Nml Nml Nml Nml Nml Nml Nml Nml Nml N/A  Right Triceps Nml Nml Nml Nml Nml Nml Nml Nml Nml Nml Nml Nml N/A  Right Deltoid Nml Nml Nml Nml Nml Nml Nml Nml Nml Nml Nml Nml N/A  Right FlexCarpiUln Nml Nml Nml Nml Nml Nml Nml Nml Nml Nml Nml Nml N/A  Right ABD Dig Min Nml Nml Nml Nml Nml Nml Nml Nml Nml Nml Nml Nml N/A  Left PronatorTeres Nml Nml Nml Nml Nml Nml Nml Nml Nml Nml Nml Nml N/A  Left Triceps Nml Nml Nml Nml Nml Nml Nml Nml Nml Nml Nml Nml N/A  Left ABD Dig Min Nml Nml Nml Nml Nml Nml Nml Nml Nml Nml Nml Nml N/A  Left FlexCarpiUln Nml Nml Nml Nml Nml Nml Nml Nml Nml Nml Nml Nml N/A  Left Deltoid Nml Nml Nml Nml Nml Nml Nml Nml Nml Nml Nml Nml N/A  Left 1stDorInt Nml Nml Nml Nml Nml Nml Nml Nml Nml Nml Nml Nml N/A      Waveforms:

## 2018-01-10 ENCOUNTER — Other Ambulatory Visit: Payer: Self-pay | Admitting: Gastroenterology

## 2018-01-10 DIAGNOSIS — R109 Unspecified abdominal pain: Secondary | ICD-10-CM

## 2018-01-28 ENCOUNTER — Ambulatory Visit
Admission: RE | Admit: 2018-01-28 | Discharge: 2018-01-28 | Disposition: A | Discharge status: Home / Self Care | Payer: Medicare Other | Source: Ambulatory Visit | Attending: Gastroenterology | Admitting: Gastroenterology

## 2018-01-28 DIAGNOSIS — R109 Unspecified abdominal pain: Secondary | ICD-10-CM

## 2018-01-28 MED ORDER — IOPAMIDOL (ISOVUE-300) INJECTION 61%
100.0000 mL | Freq: Once | INTRAVENOUS | Status: AC | PRN
  Administered 2018-01-28: 100 mL via INTRAVENOUS

## 2018-10-16 ENCOUNTER — Other Ambulatory Visit: Payer: Self-pay | Admitting: Physician Assistant

## 2018-10-16 DIAGNOSIS — Z800 Family history of malignant neoplasm of digestive organs: Secondary | ICD-10-CM

## 2018-10-16 DIAGNOSIS — R1012 Left upper quadrant pain: Secondary | ICD-10-CM

## 2018-10-23 ENCOUNTER — Ambulatory Visit
Admission: RE | Admit: 2018-10-23 | Discharge: 2018-10-23 | Disposition: A | Discharge status: Home / Self Care | Payer: Medicare Other | Source: Ambulatory Visit | Attending: Physician Assistant | Admitting: Physician Assistant

## 2018-10-23 DIAGNOSIS — R1012 Left upper quadrant pain: Secondary | ICD-10-CM

## 2018-10-23 DIAGNOSIS — Z800 Family history of malignant neoplasm of digestive organs: Secondary | ICD-10-CM

## 2019-10-19 ENCOUNTER — Ambulatory Visit (INDEPENDENT_AMBULATORY_CARE_PROVIDER_SITE_OTHER): Payer: Medicare Other

## 2019-10-19 ENCOUNTER — Ambulatory Visit (HOSPITAL_COMMUNITY)
Admission: EM | Admit: 2019-10-19 | Discharge: 2019-10-19 | Disposition: A | Discharge status: Home / Self Care | Payer: Medicare Other | Attending: Urgent Care | Admitting: Urgent Care

## 2019-10-19 ENCOUNTER — Encounter (HOSPITAL_COMMUNITY): Payer: Self-pay

## 2019-10-19 ENCOUNTER — Other Ambulatory Visit: Payer: Self-pay

## 2019-10-19 DIAGNOSIS — M79675 Pain in left toe(s): Secondary | ICD-10-CM | POA: Diagnosis not present

## 2019-10-19 DIAGNOSIS — S99922A Unspecified injury of left foot, initial encounter: Secondary | ICD-10-CM | POA: Diagnosis not present

## 2019-10-19 DIAGNOSIS — S92502A Displaced unspecified fracture of left lesser toe(s), initial encounter for closed fracture: Secondary | ICD-10-CM

## 2019-10-19 HISTORY — DX: Disorder of thyroid, unspecified: E07.9

## 2019-10-19 MED ORDER — TRAMADOL HCL 50 MG PO TABS: 50.0000 mg | ORAL_TABLET | Freq: Four times a day (QID) | ORAL | 0 refills | Status: AC | PRN

## 2019-10-19 NOTE — ED Triage Notes (Signed)
Pt presents with complains of complaints of pain, swelling, and bruising to her fourth toe on her left foot. Pt states she is not sure what happened. She noticed it yesterday while walking in the yard.

## 2019-10-19 NOTE — Discharge Instructions (Addendum)
Please schedule Tylenol at 500 mg - 650 mg once every 6 hours as needed for aches and pains.  If you still have pain despite taking Tylenol regularly, this is breakthrough pain.  You can use tramadol once every 6 hours for this.  Once your pain is better controlled, switch back to just Tylenol.  Please follow-up with [facility] for follow-up on your toe fracture.

## 2019-10-19 NOTE — ED Provider Notes (Signed)
[facility]   MRN: [id number] DOB: Oct 30, 1945  Subjective:   [name] is a 74 y.o. female presenting for 1 day history of acute onset left fourth toe pain with swelling and redness.  Patient states that symptoms started when she was standing outside in her garden.  She cannot recall of any particular trauma, insect bite, fall, tripping or stubbing her toe.  She is concerned about a fracture despite no frank trauma.  States that she has a history of multiple fractures in her foot but all were related to trauma.  Would like to have an x-ray done.  No current facility-administered medications for this encounter.  Current Outpatient Medications:  .  buPROPion (WELLBUTRIN XL) 150 MG 24 hr tablet, Take by mouth., Disp: , Rfl:  .  cycloSPORINE (RESTASIS) 0.05 % ophthalmic emulsion, , Disp: , Rfl:  .  naftifine (NAFTIN) 1 % cream, Apply topically daily., Disp: 60 g, Rfl: 3 .  RESTASIS 0.05 % ophthalmic emulsion, , Disp: , Rfl:  .  SYNTHROID 75 MCG tablet, , Disp: , Rfl:  .  urea (CARMOL) 40 % CREA, , Disp: , Rfl:  .  VIMOVO 500-20 MG TBEC, 1 TAB BY MOUTH TWO TIMES A DAY AS NEEDED PAIN, Disp: 60 tablet, Rfl: 2 .  Besifloxacin HCl (BESIVANCE) 0.6 % SUSP, , Disp: , Rfl:  .  Bromfenac Sodium (PROLENSA) 0.07 % SOLN, , Disp: , Rfl:  .  Difluprednate (DUREZOL) 0.05 % EMUL, , Disp: , Rfl:  .  venlafaxine (EFFEXOR-XR) 37.5 MG 24 hr capsule, TAKE ONE CAPSULE BY MOUTH 1 TIME A DAY FOR 1 WEEK THEN INCREASE TO 2 A DAY, Disp: 60 capsule, Rfl: 5   Allergies  Allergen Reactions  . Poison Ivy Extract Rash  . Codeine     REACTION: Itchy  . Meperidine Hcl     REACTION: Itchy    Past Medical History:  Diagnosis Date  . Thyroid condition      Past Surgical History:  Procedure Laterality Date  . FOOT SURGERY    . LAPAROTOMY      Family History  Problem Relation Age of Onset  . Cancer Mother   . Alzheimer's disease Father     Social History   Tobacco Use  . Smoking status:  Never Smoker  . Smokeless tobacco: Never Used  Substance Use Topics  . Alcohol use: Yes  . Drug use: Not on file    ROS   Objective:   Vitals: BP 115/60   Pulse 72   Temp 98.4 F (36.9 C)   Resp 17   SpO2 97%   Physical Exam Constitutional:      General: She is not in acute distress.    Appearance: Normal appearance. She is well-developed. She is not ill-appearing, toxic-appearing or diaphoretic.  HENT:     Head: Normocephalic and atraumatic.     Nose: Nose normal.     Mouth/Throat:     Mouth: Mucous membranes are moist.     Pharynx: Oropharynx is clear.  Eyes:     General: No scleral icterus.       Right eye: No discharge.        Left eye: No discharge.     Extraocular Movements: Extraocular movements intact.     Conjunctiva/sclera: Conjunctivae normal.     Pupils: Pupils are equal, round, and reactive to light.  Cardiovascular:     Rate and Rhythm: Normal rate.  Pulmonary:     Effort: Pulmonary effort  is normal.  Musculoskeletal:       Legs:  Skin:    General: Skin is warm and dry.  Neurological:     General: No focal deficit present.     Mental Status: She is alert and oriented to person, place, and time.  Psychiatric:        Mood and Affect: Mood normal.        Behavior: Behavior normal.        Thought Content: Thought content normal.        Judgment: Judgment normal.     DG Foot Complete Left  Result Date: 10/19/2019 CLINICAL DATA:  Left fourth toe injury.  Pain. EXAM: LEFT FOOT - COMPLETE 3+ VIEW COMPARISON:  None. FINDINGS: Postsurgical changes are seen in the proximal first phalanx in the distal first metatarsal. There is a fracture through the distal aspect of the proximal fourth phalanx with mild displacement. Degenerative changes at the base of the second toe. No other acute abnormalities. IMPRESSION: Fracture through the distal aspect of the proximal fourth phalanx with mild displacement. Electronically Signed   By: Gerome Sam III M.D   On:  10/19/2019 15:23     Assessment and Plan :   PDMP not reviewed this encounter.  1. Pain of toe of left foot   2. Closed fracture of phalanx of left fourth toe, initial encounter     Patient has unprovoked left fourth toe fracture.  Emphasized need for follow-up with her PCP for work-up.  Also recommended she follow-up with [facility] for follow-up on her toe fracture.  Schedule Tylenol for pain control, use tramadol for breakthrough pain. Counseled patient on potential for adverse effects with medications prescribed/recommended today, ER and return-to-clinic precautions discussed, patient verbalized understanding.    Wallis Bamberg, [place] 10/19/19 1535

## 2019-11-19 ENCOUNTER — Ambulatory Visit
Admission: EM | Admit: 2019-11-19 | Discharge: 2019-11-19 | Disposition: A | Discharge status: Home / Self Care | Payer: Medicare Other

## 2019-11-19 ENCOUNTER — Other Ambulatory Visit: Payer: Self-pay

## 2019-11-19 ENCOUNTER — Encounter: Payer: Self-pay | Admitting: *Deleted

## 2019-11-19 DIAGNOSIS — W272XXA Contact with scissors, initial encounter: Secondary | ICD-10-CM | POA: Diagnosis not present

## 2019-11-19 DIAGNOSIS — S41112A Laceration without foreign body of left upper arm, initial encounter: Secondary | ICD-10-CM | POA: Diagnosis not present

## 2019-11-19 DIAGNOSIS — Z23 Encounter for immunization: Secondary | ICD-10-CM

## 2019-11-19 MED ORDER — TETANUS-DIPHTH-ACELL PERTUSSIS 5-2.5-18.5 LF-MCG/0.5 IM SUSP
0.5000 mL | Freq: Once | INTRAMUSCULAR | Status: AC
  Administered 2019-11-19: 12:00:00 0.5 mL via INTRAMUSCULAR

## 2019-11-19 NOTE — Discharge Instructions (Addendum)
Keep area(s) clean and dry. Apply cold compress 10 minutes 3-5 times daily. Return for worsening pain, redness, swelling, discharge, fever.

## 2019-11-19 NOTE — ED Triage Notes (Signed)
Patient in with complaints to laceration on left wrist that occurred on yesterday after cutting her self with scissors when trying to open up something at the kitchen sink. Two puncture wounds noted to to left wrist with one covered with band aid.

## 2019-11-19 NOTE — ED Provider Notes (Signed)
[facility]    CSN: [id number] Arrival date & time: 11/19/19  1109      History   Chief Complaint Chief Complaint  Patient presents with  . Laceration    HPI [name] is a 74 y.o. female  Presenting for left forearm laceration, ventral aspect, that occurred yesterday.  States that it was with scissors when she is trying to cut open a plastic wrapped object.  Denies prolonged bleeding, anticoagulant or blood thinner use.  Last tetanus unknown.  Was able to irrigate at time of injury.  Past Medical History:  Diagnosis Date  . Artery stenosis (HCC)   . Thyroid condition     Patient Active Problem List   Diagnosis Date Noted  . Dry eye 06/16/2015  . Pain in toe 06/16/2015  . Adult hypothyroidism 04/07/2014  . Anxiety and depression 02/23/2014  . History of colon polyps 02/23/2014  . LBP (low back pain) 07/31/2013  . TRIGGER FINGER, THUMB 06/29/2009  . METATARSALGIA 07/31/2008  . PERSONAL HISTORY OF STRESS FRACTURE 07/31/2008  . Pain in limb 06/25/2008  . PES PLANUS 06/25/2008  . DEPRESSION 03/04/2008  . BREAST IMPLANTS, BILATERAL, HX OF 02/19/2008  . UNSPECIFIED HYPOTHYROIDISM 12/19/2007  . HYPERCHOLESTEROLEMIA 12/19/2007  . ANEMIA-NOS 12/19/2007  . MIGRAINE, OPHTHALMIC 12/19/2007  . CONJUNCTIVITIS, ALLERGIC, CHRONIC 12/19/2007  . POSTURAL HYPOTENSION 12/19/2007  . ALLERGIC RHINITIS 12/19/2007  . GERD 12/19/2007  . ENDOMETRIOSIS 12/19/2007  . OSTEOARTHRITIS 12/19/2007  . 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 SCIATICA, ACUTE 12/19/2007  . OSTEOPOROSIS 12/19/2007  . 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 WEIGHT GAIN, ABNORMAL 12/19/2007  . URINARY INCONTINENCE 12/19/2007  . 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 IRRITABLE BOWEL SYNDROME, HX OF 12/19/2007    Past Surgical History:  Procedure Laterality Date  . FOOT SURGERY    . LAPAROTOMY      OB History   No obstetric history on file.       Home Medications    Prior to Admission medications   Medication Sig Start Date End Date Taking? Authorizing Provider  SYNTHROID 75 MCG tablet  07/29/15  Yes [provider]  traMADol (ULTRAM) 50 MG tablet Take 1 tablet (50 mg total) by mouth every 6 (six) hours as needed. 10/19/19  Yes Wallis Bamberg, PA-C  Besifloxacin HCl (BESIVANCE) 0.6 % SUSP  07/15/15   [provider]  Bromfenac Sodium (PROLENSA) 0.07 % SOLN  07/15/15   [provider]  buPROPion (WELLBUTRIN XL) 150 MG 24 hr tablet Take by mouth. 04/30/15 10/19/19  [provider]  cycloSPORINE (RESTASIS) 0.05 % ophthalmic emulsion  05/13/15   [provider]  Difluprednate (DUREZOL) 0.05 % EMUL  07/15/15   [provider]  naftifine (NAFTIN) 1 % cream Apply topically daily. 08/12/15   Hyatt, Max T, DPM  RESTASIS 0.05 % ophthalmic emulsion  07/24/15   [provider]  urea (CARMOL) 40 % CREA  06/16/15   [provider]  venlafaxine (EFFEXOR-XR) 37.5 MG 24 hr capsule TAKE ONE CAPSULE BY MOUTH 1 TIME A DAY FOR 1 WEEK THEN INCREASE TO 2 A DAY 01/01/11   Bedsole, Amy E, MD  VIMOVO 500-20 MG TBEC 1 TAB BY MOUTH TWO TIMES A DAY AS NEEDED PAIN 12/16/10   Bedsole, Amy E, MD  VITAMIN D, CHOLECALCIFEROL, PO Take by mouth.    [provider]    Family History Family History  Problem Relation Age of Onset  . Cancer Mother   .  Alzheimer's disease Father     Social History Social History   Tobacco Use  . Smoking status: Never Smoker  . Smokeless tobacco: Never Used  Vaping Use  . Vaping Use: Never used  Substance Use Topics  . Alcohol use: Yes  . Drug use: Not Currently     Allergies   Poison ivy extract, Codeine, and Meperidine hcl   Review of Systems As per HPI   Physical Exam Triage Vital Signs ED Triage Vitals  Enc Vitals Group     BP 11/19/19 1123 112/76     Pulse Rate 11/19/19 1123 67     Resp 11/19/19 1123 16     Temp 11/19/19 1123 98.4 F  (36.9 C)     Temp Source 11/19/19 1123 Oral     SpO2 11/19/19 1123 96 %     Weight --      Height --      Head Circumference --      Peak Flow --      Pain Score 11/19/19 1124 6     Pain Loc --      Pain Edu? --      Excl. in GC? --    No data found.  Updated Vital Signs BP 112/76 (BP Location: Right Arm)   Pulse 67   Temp 98.4 F (36.9 C) (Oral)   Resp 16   SpO2 96%   Visual Acuity Right Eye Distance:   Left Eye Distance:   Bilateral Distance:    Right Eye Near:   Left Eye Near:    Bilateral Near:     Physical Exam Constitutional:      General: She is not in acute distress. HENT:     Head: Normocephalic and atraumatic.  Eyes:     General: No scleral icterus.    Pupils: Pupils are equal, round, and reactive to light.  Cardiovascular:     Rate and Rhythm: Normal rate.  Pulmonary:     Effort: Pulmonary effort is normal.  Skin:    Coloration: Skin is not jaundiced or pale.     Comments: 7 mm superficial laceration to ventral aspect of forearm.  Mild surrounding erythema with tenderness.  No warmth, discharge or streaking.  Neurological:     Mental Status: She is alert and oriented to person, place, and time.      UC Treatments / Results  Labs (all labs ordered are listed, but only abnormal results are displayed) Labs Reviewed - No data to display  EKG   Radiology No results found.  Procedures Laceration Repair  Date/Time: 11/19/2019 12:10 PM Performed by: Shea Evans, PA-C Authorized by: Shea Evans, PA-C   Consent:    Consent obtained:  Verbal   Consent given by:  Patient   Risks discussed:  Infection, need for additional repair, pain, poor cosmetic result and poor wound healing   Alternatives discussed:  No treatment and delayed treatment Universal protocol:    Patient identity confirmed:  Verbally with patient Anesthesia (see MAR for exact dosages):    Anesthesia method:  None Laceration details:    Location:   Shoulder/arm   Shoulder/arm location:  L lower arm   Length (cm):  0.7   Depth (mm):  1 Repair type:    Repair type:  Simple Pre-procedure details:    Preparation:  Patient was prepped and draped in usual sterile fashion Exploration:    Hemostasis achieved with:  Direct pressure   Wound exploration: wound explored through full range  of motion     Wound extent: no nerve damage noted     Contaminated: no   Treatment:    Area cleansed with:  Soap and water   Amount of cleaning:  Standard   Irrigation solution:  Tap water   Irrigation method:  Tap Skin repair:    Repair method:  Steri-Strips   Number of Steri-Strips:  1 Approximation:    Approximation:  Close Post-procedure details:    Dressing:  Adhesive bandage   Patient tolerance of procedure:  Tolerated well, no immediate complications   (including critical care time)  Medications Ordered in UC Medications  Tdap (BOOSTRIX) injection 0.5 mL (has no administration in time range)    Initial Impression / Assessment and Plan / UC Course  I have reviewed the triage vital signs and the nursing notes.  Pertinent labs & imaging results that were available during my care of the patient were reviewed by me and considered in my medical decision making (see chart for details).     Steri-Strip placed in office, patient tolerated well.  Tetanus updated.  Low concern for secondary cellulitis at this time, though reviewed return precautions thereof.  Return precautions discussed, pt verbalized understanding and is agreeable to plan. Final Clinical Impressions(s) / UC Diagnoses   Final diagnoses:  Laceration of left upper extremity, initial encounter     Discharge Instructions     Keep area(s) clean and dry. Apply cold compress 10 minutes 3-5 times daily. Return for worsening pain, redness, swelling, discharge, fever.    ED Prescriptions    None     PDMP not reviewed this encounter.   Hall-Potvin, [place], [place] 11/19/19  1211

## 2020-02-28 ENCOUNTER — Ambulatory Visit
Admission: EM | Admit: 2020-02-28 | Discharge: 2020-02-28 | Discharge status: Against Medical Advice | Payer: Medicare Other

## 2021-04-19 ENCOUNTER — Encounter: Payer: Medicare Other | Attending: Physician Assistant | Admitting: Physician Assistant

## 2021-04-19 ENCOUNTER — Other Ambulatory Visit: Payer: Self-pay

## 2021-04-19 DIAGNOSIS — L97812 Non-pressure chronic ulcer of other part of right lower leg with fat layer exposed: Secondary | ICD-10-CM | POA: Insufficient documentation

## 2021-04-19 DIAGNOSIS — E039 Hypothyroidism, unspecified: Secondary | ICD-10-CM | POA: Diagnosis not present

## 2021-04-19 NOTE — Progress Notes (Signed)
QUANNA, SCHOLLMEYER ([id number]) Visit Report for 04/19/2021 Chief Complaint Document Details Patient Name: [name], [name]. Date of Service: 04/19/2021 8:45 AM Medical Record Number: [id number] Patient Account Number: [id number] Date of Birth/Sex: 22-Jun-1945 (76 y.o. Female) Treating RN: Carlene Coria Primary Care Provider: Suzanna Obey Other Clinician: Referring Provider: Armond Hang Treating Provider/Extender: Skipper Cliche in Treatment: 0 Information Obtained from: Patient Chief Complaint Right LE surgical ulcer Electronic Signature(s) Signed: 04/19/2021 9:32:13 AM By: Worthy Keeler PA-C Entered By: Worthy Keeler on 04/19/2021 09:32:13 Maloney, [name] ([id number]) -------------------------------------------------------------------------------- Debridement Details Patient Name: [name] Date of Service: 04/19/2021 8:45 AM Medical Record Number: [id number] Patient Account Number: [id number] Date of Birth/Sex: 08/07/1945 (76 y.o. Female) Treating RN: Carlene Coria Primary Care Provider: Suzanna Obey Other Clinician: Referring Provider: Armond Hang Treating Provider/Extender: Skipper Cliche in Treatment: 0 Debridement Performed for Wound #1 Right Lower Leg Assessment: Performed By: Physician Tommie Sams., PA-C Debridement Type: Debridement Level of Consciousness (Pre- Awake and Alert procedure): Pre-procedure Verification/Time Out Yes - 09:35 Taken: Start Time: 09:35 Pain Control: Lidocaine 4% Topical Solution Total Area Debrided (L x W): 3 (cm) x 0.7 (cm) = 2.1 (cm) Tissue and other material Viable, Non-Viable, Slough, Subcutaneous, Biofilm, Slough debrided: Level: Skin/Subcutaneous Tissue Debridement Description: Excisional Instrument: Curette Bleeding: Minimum Hemostasis Achieved: Pressure End Time: 09:40 Procedural Pain: 0 Post Procedural Pain: 0 Response to Treatment: Procedure was tolerated well Level of Consciousness (Post- Awake and  Alert procedure): Post Debridement Measurements of Total Wound Length: (cm) 3 Width: (cm) 0.7 Depth: (cm) 0.2 Volume: (cm) 0.33 Character of Wound/Ulcer Post Debridement: Improved Post Procedure Diagnosis Same as Pre-procedure Electronic Signature(s) Signed: 04/19/2021 4:37:47 PM By: Worthy Keeler PA-C Signed: 04/19/2021 4:37:51 PM By: Carlene Coria RN Entered By: Carlene Coria on 04/19/2021 09:39:13 Bodley, [name] ([id number]) -------------------------------------------------------------------------------- HPI Details Patient Name: [name]. Date of Service: 04/19/2021 8:45 AM Medical Record Number: [id number] Patient Account Number: [id number] Date of Birth/Sex: [birth year] (76 y.o. Female) Treating RN: Carlene Coria Primary Care Provider: Suzanna Obey Other Clinician: Referring Provider: Armond Hang Treating Provider/Extender: Skipper Cliche in Treatment: 0 History of Present Illness HPI Description: 04/19/2021 upon evaluation today patient appears to be doing well with regard to her wound all things considered. She is actually been to the dermatology and skin surgery center in [place]. 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 The good news is that margins were considered to be completely clear post the procedure. 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 This surgery was performed on 03/25/2021 and mainly that they have been using Neosporin for the time being. Electronic Signature(s) Signed: 04/19/2021 10:14:08 AM By: Worthy Keeler PA-C Entered By: Worthy Keeler on 04/19/2021 10:14:08 [name] ([id number]) -------------------------------------------------------------------------------- Physical Exam Details Patient Name: [name], Pam C. Date of Service: 04/19/2021 8:45  AM Medical Record Number: [id number] Patient Account Number: [id number] Date of Birth/Sex: 1945/11/02 (76 y.o. Female) Treating RN: Carlene Coria Primary Care Provider: Suzanna Obey Other Clinician: Referring Provider: Armond Hang Treating Provider/Extender: Skipper Cliche in Treatment: 0 Constitutional sitting or standing blood pressure is within target range for patient.. pulse regular and within target range for patient.Marland Kitchen respirations regular, non- labored and within target range for patient.Marland Kitchen temperature within target range for patient.. Well-nourished and well-hydrated in no acute  distress. Eyes conjunctiva clear no eyelid edema noted. pupils equal round and reactive to light and accommodation. Ears, Nose, Mouth, and Throat no gross abnormality of ear auricles or external auditory canals. normal hearing noted during conversation. mucus membranes moist. Respiratory normal breathing without difficulty. Cardiovascular 2+ dorsalis pedis/posterior tibialis pulses. no clubbing, cyanosis, significant edema, <3 sec cap refill. Musculoskeletal normal gait and posture. no significant deformity or arthritic changes, no loss or range of motion, no clubbing. Psychiatric this patient is able to make decisions and demonstrates good insight into disease process. Alert and Oriented x 3. pleasant and cooperative. Notes Upon inspection patient's wound bed actually showed signs of some slough and biofilm buildup. I actually did go ahead and perform sharp debridement after discussing this with the patient today. She tolerated the debridement today without complication and postdebridement the wound bed appears to be doing much better which is great news and overall I am extremely pleased with where we stand currently. Electronic Signature(s) Signed: 04/19/2021 10:14:37 AM By: Worthy Keeler PA-C Entered By: Worthy Keeler on 04/19/2021 10:14:37 [name]  ([id number]) -------------------------------------------------------------------------------- Physician Orders Details Patient Name: [name], LARROW. Date of Service: 04/19/2021 8:45 AM Medical Record Number: [id number] Patient Account Number: [id number] Date of Birth/Sex: 05/27/1945 (76 y.o. Female) Treating RN: Carlene Coria Primary Care Provider: Suzanna Obey Other Clinician: Referring Provider: Armond Hang Treating Provider/Extender: Skipper Cliche in Treatment: 0 Verbal / Phone Orders: No Diagnosis Coding ICD-10 Coding Code Description T81.31XA Disruption of external operation (surgical) wound, not elsewhere classified, initial encounter L97.812 Non-pressure chronic ulcer of other part of right lower leg with fat layer exposed Follow-up Appointments o Return Appointment in 1 week. Bathing/ Shower/ Hygiene o May shower; gently cleanse wound with antibacterial soap, rinse and pat dry prior to dressing wounds Edema Control - Lymphedema / Segmental Compressive Device / Other o Elevate, Exercise Daily and Avoid Standing for Long Periods of Time. o Elevate legs to the level of the heart and pump ankles as often as possible o Elevate leg(s) parallel to the floor when sitting. Wound Treatment Wound #1 - Lower Leg Wound Laterality: Right Primary Dressing: IODOFLEX 0.9% Cadexomer Iodine Pad 3 x Per Day/30 Days Discharge Instructions: Apply Iodoflex to wound bed only as directed. Secondary Dressing: Zetuvit Plus Silicone Border Dressing 4x4 (in/in) (DME) (Generic) 3 x Per Day/30 Days Electronic Signature(s) Signed: 04/19/2021 4:37:47 PM By: Worthy Keeler PA-C Signed: 04/19/2021 4:37:51 PM By: Carlene Coria RN Entered By: Carlene Coria on 04/19/2021 09:41:15 Mestre, [name] ([id number]) -------------------------------------------------------------------------------- Problem List Details Patient Name: [name], RICOTTA. Date of Service: 04/19/2021 8:45 AM Medical Record  Number: [id number] Patient Account Number: [id number] Date of Birth/Sex: 08-16-1945 (76 y.o. Female) Treating RN: Carlene Coria Primary Care Provider: Suzanna Obey Other Clinician: Referring Provider: Armond Hang Treating Provider/Extender: Skipper Cliche in Treatment: 0 Active Problems ICD-10 Encounter Code Description Active Date MDM Diagnosis T81.31XA Disruption of external operation (surgical) wound, not elsewhere 04/19/2021 No Yes classified, initial encounter L97.812 Non-pressure chronic ulcer of other part of right lower leg with fat layer 04/19/2021 No Yes exposed Inactive Problems Resolved Problems Electronic Signature(s) Signed: 04/19/2021 9:31:53 AM By: Worthy Keeler PA-C Entered By: Worthy Keeler on 04/19/2021 09:31:52 Hurtubise, [name] ([id number]) -------------------------------------------------------------------------------- Progress Note Details Patient Name: [name]. Date of Service: 04/19/2021 8:45 AM Medical Record Number: [id number] Patient Account Number: [id number] Date of Birth/Sex: 1946-02-06 (76 y.o. Female) Treating RN: Carlene Coria Primary Care Provider: Suzanna Obey Other Clinician: Referring  Provider: Armond Hang Treating Provider/Extender: Skipper Cliche in Treatment: 0 Subjective Chief Complaint Information obtained from Patient Right LE surgical ulcer History of Present Illness (HPI) 04/19/2021 upon evaluation today patient appears to be doing well with regard to her wound all things considered. She is actually been to the dermatology and skin surgery center in [place]. 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 The good news is that margins were considered to be completely clear post the procedure. 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  This surgery was performed on 03/25/2021 and mainly that they have been using Neosporin for the time being. Patient History Allergies codeine, meperidine hydrochloride Social History Never smoker, Marital Status - Married, Alcohol Use - Daily, Drug Use - No History, Caffeine Use - Daily. Review of Systems (ROS) Eyes Complains or has symptoms of Glasses / Contacts. Endocrine Complains or has symptoms of Thyroid disease. Integumentary (Skin) Complains or has symptoms of Wounds. Objective Constitutional sitting or standing blood pressure is within target range for patient.. pulse regular and within target range for patient.Marland Kitchen respirations regular, non- labored and within target range for patient.Marland Kitchen temperature within target range for patient.. Well-nourished and well-hydrated in no acute distress. Vitals Time Taken: 8:50 AM, Height: 60 in, Source: Stated, Weight: 112 lbs, Source: Stated, BMI: 21.9, Temperature: 98.3 F, Pulse: 72 bpm, Respiratory Rate: 18 breaths/min, Blood Pressure: 152/88 mmHg. Eyes conjunctiva clear no eyelid edema noted. pupils equal round and reactive to light and accommodation. Ears, Nose, Mouth, and Throat no gross abnormality of ear auricles or external auditory canals. normal hearing noted during conversation. mucus membranes moist. Respiratory normal breathing without difficulty. Cardiovascular 2+ dorsalis pedis/posterior tibialis pulses. no clubbing, cyanosis, significant edema, Musculoskeletal normal gait and posture. no significant deformity or arthritic changes, no loss or range of motion, no clubbing. Psychiatric this patient is able to make decisions and demonstrates good insight into disease process. Alert and Oriented x 3. pleasant and cooperative. [name], [name] ([id number]) General Notes: Upon inspection patient's wound bed actually showed signs of some slough and biofilm buildup. I actually did go ahead and perform sharp debridement after discussing  this with the patient today. She tolerated the debridement today without complication and postdebridement the wound bed appears to be doing much better which is great news and overall I am extremely pleased with where we stand currently. Integumentary (Hair, Skin) Wound #1 status is Open. Original cause of wound was Surgical Injury. The date acquired was: 03/25/2021. The wound is located on the Right Lower Leg. The wound measures 3cm length x 0.7cm width x 0.2cm depth; 1.649cm^2 area and 0.33cm^3 volume. There is Fat Layer (Subcutaneous Tissue) exposed. There is no tunneling or undermining noted. There is a medium amount of serosanguineous drainage noted. There is medium (34-66%) red, pink granulation within the wound bed. There is a medium (34-66%) amount of necrotic tissue within the wound bed. Assessment Active Problems ICD-10 Disruption of external operation (surgical) wound, not elsewhere classified, initial encounter Non-pressure chronic ulcer of other part of right lower leg with fat layer exposed Procedures Wound #1 Pre-procedure diagnosis of Wound #1 is a Dehisced Wound located on the Right Lower Leg . There was a Excisional Skin/Subcutaneous Tissue Debridement with a total area of 2.1 sq cm performed by Tommie Sams., PA-C. With the following  instrument(s): Curette to remove Viable and Non-Viable tissue/material. Material removed includes Subcutaneous Tissue, Slough, and Biofilm after achieving pain control using Lidocaine 4% Topical Solution. No specimens were taken. A time out was conducted at 09:35, prior to the start of the procedure. A Minimum amount of bleeding was controlled with Pressure. The procedure was tolerated well with a pain level of 0 throughout and a pain level of 0 following the procedure. Post Debridement Measurements: 3cm length x 0.7cm width x 0.2cm depth; 0.33cm^3 volume. Character of Wound/Ulcer Post Debridement is improved. Post procedure Diagnosis Wound #1:  Same as Pre-Procedure Plan Follow-up Appointments: Return Appointment in 1 week. Bathing/ Shower/ Hygiene: May shower; gently cleanse wound with antibacterial soap, rinse and pat dry prior to dressing wounds Edema Control - Lymphedema / Segmental Compressive Device / Other: Elevate, Exercise Daily and Avoid Standing for Long Periods of Time. Elevate legs to the level of the heart and pump ankles as often as possible Elevate leg(s) parallel to the floor when sitting. WOUND #1: - Lower Leg Wound Laterality: Right Primary Dressing: IODOFLEX 0.9% Cadexomer Iodine Pad 3 x Per Day/30 Days Discharge Instructions: Apply Iodoflex to wound bed only as directed. Secondary Dressing: Zetuvit Plus Silicone Border Dressing 4x4 (in/in) (DME) (Generic) 3 x Per Day/30 Days 1. Would recommend that we initiate treatment with Iodoflex which I think is can be a good option for her. She should be changing this 3 times per week. 2. I am also can recommend Zetuvit border foam dressing to cover. 3. The area of undermining where she has a secondary opening along the incision location has not really attached and this is good to be somewhat difficult to probably get it to attach. It could end up opening completely or it may end up closing time will tell we shall see how this does. We will see patient back for reevaluation in 1 week here in the clinic. If anything worsens or changes patient will contact our office for additional recommendations. [name], [name] ([id number]) Electronic Signature(s) Signed: 04/19/2021 10:15:20 AM By: Worthy Keeler PA-C Entered By: Worthy Keeler on 04/19/2021 10:15:20 [name], [name] ([id number]) -------------------------------------------------------------------------------- ROS/PFSH Details Patient Name: [name], LOMBARDOZZI. Date of Service: 04/19/2021 8:45 AM Medical Record Number: [id number] Patient Account Number: [id number] Date of Birth/Sex: 08/10/1945 (76 y.o. Female) Treating  RN: Carlene Coria Primary Care Provider: Suzanna Obey Other Clinician: Referring Provider: Armond Hang Treating Provider/Extender: Skipper Cliche in Treatment: 0 Eyes Complaints and Symptoms: Positive for: Glasses / Contacts Endocrine Complaints and Symptoms: Positive for: Thyroid disease Integumentary (Skin) Complaints and Symptoms: Positive for: Wounds Immunizations Pneumococcal Vaccine: Received Pneumococcal Vaccination: Yes Received Pneumococcal Vaccination On or After 60th Birthday: Yes Implantable Devices Yes Family and Social History Never smoker; Marital Status - Married; Alcohol Use: Daily; Drug Use: No History; Caffeine Use: Daily Electronic Signature(s) Signed: 04/19/2021 4:37:47 PM By: Worthy Keeler PA-C Signed: 04/19/2021 4:37:51 PM By: Carlene Coria RN Entered By: Carlene Coria on 04/19/2021 08:54:38 Zaugg, [name] ([id number]) -------------------------------------------------------------------------------- SuperBill Details Patient Name: [name], [name]. Date of Service: 04/19/2021 Medical Record Number: [id number] Patient Account Number: [id number] Date of Birth/Sex: 1945-10-16 (76 y.o. Female) Treating RN: Carlene Coria Primary Care Provider: Suzanna Obey Other Clinician: Referring Provider: Armond Hang Treating Provider/Extender: Skipper Cliche in Treatment: 0 Diagnosis Coding ICD-10 Codes Code Description T81.31XA Disruption of external operation (surgical) wound, not elsewhere classified, initial encounter L97.812 Non-pressure chronic ulcer of other part of right lower leg with fat layer exposed Facility Procedures  CPT4 Code: [id number] Description: [id number] - WOUND CARE VISIT-LEV 3 EST PT Modifier: Quantity: 1 CPT4 Code: [id number] Description: [id number] - DEB SUBQ TISSUE 20 SQ CM/< Modifier: Quantity: 1 CPT4 Code: Description: ICD-10 Diagnosis Description T81.31XA Disruption of external operation (surgical) wound, not elsewhere  classified Modifier: , initial encounter Quantity: Physician Procedures CPT4 Code: [id number] Description: [id number] - WC PHYS LEVEL 4 - NEW PT Modifier: 25 Quantity: 1 CPT4 Code: Description: ICD-10 Diagnosis Description T81.31XA Disruption of external operation (surgical) wound, not elsewhere classifie [id number] Non-pressure chronic ulcer of other part of right lower leg with fat layer Modifier: d, initial encounter exposed Quantity: CPT4 Code: [id number] Description: 11042 - WC PHYS SUBQ TISS 20 SQ CM Modifier: Quantity: 1 CPT4 Code: Description: ICD-10 Diagnosis Description T81.31XA Disruption of external operation (surgical) wound, not elsewhere classifie Modifier: d, initial encounter Quantity: Electronic Signature(s) Signed: 04/19/2021 10:15:36 AM By: Worthy Keeler PA-C Entered By: Worthy Keeler on 04/19/2021 10:15:36

## 2021-04-19 NOTE — Progress Notes (Signed)
[name], [name] ([id number]) Visit Report for 04/19/2021 Abuse Risk Screen Details Patient Name: [name], [name]. Date of Service: 04/19/2021 8:45 AM Medical Record Number: [id number] Patient Account Number: [id number] Date of Birth/Sex: 07-10-1945 (76 y.o. Female) Treating RN: [name] Primary Care [name]: [name] Other Clinician: Referring [name]: [name] Treating Daritza Brees/Extender: Rowan Blase in Treatment: 0 Abuse Risk Screen Items Answer ABUSE RISK SCREEN: Has anyone close to you tried to hurt or harm you recentlyo No Do you feel uncomfortable with anyone in your familyo No Has anyone forced you do things that you didnot want to doo No Electronic Signature(s) Signed: 04/19/2021 4:37:51 PM By: Pam Pax RN Entered By: [name] on 04/19/2021 08:54:45 [name], [name] Kitchen ([id number]) -------------------------------------------------------------------------------- Activities of Daily Living Details Patient Name: [name]. Date of Service: 04/19/2021 8:45 AM Medical Record Number: [id number] Patient Account Number: [id number] Date of Birth/Sex: 09/25/1945 (76 y.o. Female) Treating RN: [name] Primary Care Bianna Haran: [name] Other Clinician: Referring Lonzy Mato: [name] Treating Teshara Moree/Extender: Rowan Blase in Treatment: 0 Activities of Daily Living Items Answer Activities of Daily Living (Please select one for each item) Drive Automobile Completely Able Take Medications Completely Able Use Telephone Completely Able Care for Appearance Completely Able Use Toilet Completely Able Bath / Shower Completely Able Dress Self Completely Able Feed Self Completely Able Walk Completely Able Get In / Out Bed Completely Able Housework Completely Able Prepare Meals Completely Able Handle Money Completely Able Shop for Self Completely Able Electronic Signature(s) Signed: 04/19/2021 4:37:51 PM By: Pam Pax RN Entered By:  [name] on 04/19/2021 08:55:07 [name], [name] ([id number]) -------------------------------------------------------------------------------- Education Screening Details Patient Name: [name] Date of Service: 04/19/2021 8:45 AM Medical Record Number: [id number] Patient Account Number: [id number] Date of Birth/Sex: Mar 14, 1945 (76 y.o. Female) Treating RN: [name] Primary Care Mazikeen Hehn: [name] Other Clinician: Referring Daci Stubbe: [name] Treating Lem Peary/Extender: Rowan Blase in Treatment: 0 Learning Preferences/Education Level/Primary Language Learning Preference: Explanation Highest Education Level: College or Above Preferred Language: English Cognitive Barrier Language Barrier: No Translator Needed: No Memory Deficit: No Emotional Barrier: No Cultural/Religious Beliefs Affecting Medical Care: No Physical Barrier Impaired Vision: Yes Glasses Impaired Hearing: No Decreased Hand dexterity: No Knowledge/Comprehension Knowledge Level: Medium Comprehension Level: High Ability to understand written instructions: High Ability to understand verbal instructions: High Motivation Anxiety Level: Anxious Cooperation: Cooperative Education Importance: Acknowledges Need Interest in Health Problems: Asks Questions Perception: Coherent Willingness to Engage in Self-Management High Activities: Readiness to Engage in Self-Management High Activities: Electronic Signature(s) Signed: 04/19/2021 4:37:51 PM By: Pam Pax RN Entered By: [name] on 04/19/2021 08:56:37 [name], [name] ([id number]) -------------------------------------------------------------------------------- Fall Risk Assessment Details Patient Name: [name] Date of Service: 04/19/2021 8:45 AM Medical Record Number: [id number] Patient Account Number: [id number] Date of Birth/Sex: 1945-03-06 (76 y.o. Female) Treating RN: [name] Primary Care Cherine Drumgoole: [name]  Other Clinician: Referring Dorien Mayotte: [name] Treating Jazaria Jarecki/Extender: Rowan Blase in Treatment: 0 Fall Risk Assessment Items Have you had 2 or more falls in the last 12 monthso 0 No Have you had any fall that resulted in injury in the last 12 monthso 0 No FALLS RISK SCREEN History of falling - immediate or within 3 months 0 No Secondary diagnosis (Do you have 2 or more medical diagnoseso) 0 No Ambulatory aid None/bed rest/wheelchair/nurse 0 No Crutches/cane/walker 0 No Furniture 0 No Intravenous therapy Access/Saline/Heparin Lock 0 No Gait/Transferring Normal/ bed rest/ wheelchair 0 No Weak (short steps with or without shuffle,  stooped but able to lift head while walking, may 0 No seek support from furniture) Impaired (short steps with shuffle, may have difficulty arising from chair, head down, impaired 0 No balance) Mental Status Oriented to own ability 0 No Electronic Signature(s) Signed: 04/19/2021 4:37:51 PM By: Pam Pax RN Entered By: [name] on 04/19/2021 08:56:43 [name], [name] ([id number]) -------------------------------------------------------------------------------- Foot Assessment Details Patient Name: [name], Pam C. Date of Service: 04/19/2021 8:45 AM Medical Record Number: [id number] Patient Account Number: [id number] Date of Birth/Sex: September 23, 1945 (76 y.o. Female) Treating RN: [name] Primary Care Cameryn Chrisley: [name] Other Clinician: Referring Vaeda Westall: [name] Treating Joyia Riehle/Extender: Rowan Blase in Treatment: 0 Foot Assessment Items Site Locations + = Sensation present, - = Sensation absent, C = Callus, U = Ulcer R = Redness, W = Warmth, M = Maceration, PU = Pre-ulcerative lesion F = Fissure, S = Swelling, D = Dryness Assessment Right: Left: Other Deformity: No No Prior Foot Ulcer: No No Prior Amputation: No No Charcot Joint: No No Ambulatory Status: Ambulatory Without Help Gait:  Steady Electronic Signature(s) Signed: 04/19/2021 4:37:51 PM By: Pam Pax RN Entered By: [name] on 04/19/2021 09:06:51 [name], [name] ([id number]) -------------------------------------------------------------------------------- Nutrition Risk Screening Details Patient Name: [name], Pam C. Date of Service: 04/19/2021 8:45 AM Medical Record Number: [id number] Patient Account Number: [id number] Date of Birth/Sex: 04/23/1945 (76 y.o. Female) Treating RN: [name] Primary Care Cyncere Ruhe: [name] Other Clinician: Referring Suleyma Wafer: [name] Treating Danni Shima/Extender: Rowan Blase in Treatment: 0 Height (in): 60 Weight (lbs): 112 Body Mass Index (BMI): 21.9 Nutrition Risk Screening Items Score Screening NUTRITION RISK SCREEN: I have an illness or condition that made me change the kind and/or amount of food I eat 0 No I eat fewer than two meals per day 0 No I eat few fruits and vegetables, or milk products 0 No I have three or more drinks of beer, liquor or wine almost every day 0 No I have tooth or mouth problems that make it hard for me to eat 0 No I don't always have enough money to buy the food I need 0 No I eat alone most of the time 0 No I take three or more different prescribed or over-the-counter drugs a day 1 Yes Without wanting to, I have lost or gained 10 pounds in the last six months 0 No I am not always physically able to shop, cook and/or feed myself 0 No Nutrition Protocols Good Risk Protocol 0 No interventions needed Moderate Risk Protocol High Risk Proctocol Risk Level: Good Risk Score: 1 Electronic Signature(s) Signed: 04/19/2021 4:37:51 PM By: Pam Pax RN Entered By: [name] on 04/19/2021 08:57:07

## 2021-04-19 NOTE — Progress Notes (Signed)
CIEL, CHERVENAK ([id number]) Visit Report for 04/19/2021 Allergy List Details Patient Name: [name], [name]. Date of Service: 04/19/2021 8:45 AM Medical Record Number: [id number] Patient Account Number: [id number] Date of Birth/Sex: [birth year] (76 y.o. Female) Treating RN: Yevonne Pax Primary Care Daven Montz: Delbert Harness Other Clinician: Referring Omere Marti: Netta Cedars Treating Giavonni Fonder/Extender: Rowan Blase in Treatment: 0 Allergies Active Allergies codeine meperidine hydrochloride Type: Medication Allergy Notes Electronic Signature(s) Signed: 04/19/2021 4:37:51 PM By: Yevonne Pax RN Entered By: Yevonne Pax on 04/19/2021 08:52:26 Delay, Raliegh Scarlet ([id number]) -------------------------------------------------------------------------------- Arrival Information Details Patient Name: [name]. Date of Service: 04/19/2021 8:45 AM Medical Record Number: [id number] Patient Account Number: [id number] Date of Birth/Sex: [birth year] (76 y.o. Female) Treating RN: Yevonne Pax Primary Care Rain Wilhide: Delbert Harness Other Clinician: Referring Satia Winger: Netta Cedars Treating Delphin Funes/Extender: Rowan Blase in Treatment: 0 Visit Information Patient Arrived: Ambulatory Arrival Time: 08:49 Accompanied By: husband Transfer Assistance: Manual Patient Identification Verified: Yes Secondary Verification Process Completed: Yes Patient Requires Transmission-Based Precautions: No Patient Has Alerts: No Electronic Signature(s) Signed: 04/19/2021 4:37:51 PM By: Yevonne Pax RN Entered By: Yevonne Pax on 04/19/2021 08:49:37 Frink, Raliegh Scarlet ([id number]) -------------------------------------------------------------------------------- Clinic Level of Care Assessment Details Patient Name: [name]. Date of Service: 04/19/2021 8:45 AM Medical Record Number: [id number] Patient Account Number: [id number] Date of Birth/Sex: [birth year] (76 y.o. Female) Treating RN: Yevonne Pax Primary Care Amelia Burgard: Delbert Harness Other Clinician: Referring Garrin Kirwan: Netta Cedars Treating Evelynn Hench/Extender: Rowan Blase in Treatment: 0 Clinic Level of Care Assessment Items TOOL 1 Quantity Score X - Use when EandM and Procedure is performed on INITIAL visit 1 0 ASSESSMENTS - Nursing Assessment / Reassessment X - General Physical Exam (combine w/ comprehensive assessment (listed just below) when performed on new 1 20 pt. evals) X- 1 25 Comprehensive Assessment (HX, ROS, Risk Assessments, Wounds Hx, etc.) ASSESSMENTS - Wound and Skin Assessment / Reassessment []  - Dermatologic / Skin Assessment (not related to wound area) 0 ASSESSMENTS - Ostomy and/or Continence Assessment and Care []  - Incontinence Assessment and Management 0 []  - 0 Ostomy Care Assessment and Management (repouching, etc.) PROCESS - Coordination of Care X - Simple Patient / Family Education for ongoing care 1 15 []  - 0 Complex (extensive) Patient / Family Education for ongoing care X- 1 10 Staff obtains , Records, Test Results / Process Orders []  - 0 Staff telephones HHA, Nursing Homes / Clarify orders / etc []  - 0 Routine Transfer to another Facility (non-emergent condition) []  - 0 Routine Hospital Admission (non-emergent condition) X- 1 15 New Admissions / / Ordering NPWT, Apligraf, etc. []  - 0 Emergency Hospital Admission (emergent condition) PROCESS - Special Needs []  - Pediatric / Minor Patient Management 0 []  - 0 Isolation Patient Management []  - 0 Hearing / Language / Visual special needs []  - 0 Assessment of Community assistance (transportation, D/C planning, etc.) []  - 0 Additional assistance / Altered mentation []  - 0 Support Surface(s) Assessment (bed, cushion, seat, etc.) INTERVENTIONS - Miscellaneous []  - External ear exam 0 []  - 0 Patient Transfer (multiple staff / / Similar devices) []  - 0 Simple Staple / Suture  removal (25 or less) []  - 0 Complex Staple / Suture removal (26 or more) []  - 0 Hypo/Hyperglycemic Management (do not check if billed separately) X- 1 15 Ankle / Brachial Index (ABI) - do not check if billed separately Has the patient been seen at the hospital within the last three years: Yes Total Score: 100 Level  Of Care: New/Established - Level 3 Pam MillerBROWN, Maiko C. ([id number]) Electronic Signature(s) Signed: 04/19/2021 4:37:51 PM By: Yevonne PaxEpps, Carrie RN Entered By: Yevonne PaxEpps, Carrie on 04/19/2021 09:40:11 Manson PasseyBROWN, Raliegh ScarletGLENDA C. ([id number]) -------------------------------------------------------------------------------- Encounter Discharge Information Details Patient Name: Pam MillerBROWN, [name] Date of Service: 04/19/2021 8:45 AM Medical Record Number: [id number] Patient Account Number: [id number] Date of Birth/Sex: 06/16/1945 (76 y.o. Female) Treating RN: Yevonne PaxEpps, Carrie Primary Care Ayushi Pla: Delbert HarnessBRISCOE, KIM Other Clinician: Referring Kathleen Likins: Netta CedarsAMANATHAN, DEEPAK Treating Mel Tadros/Extender: Rowan BlaseStone, Hoyt Weeks in Treatment: 0 Encounter Discharge Information Items Post Procedure Vitals Discharge Condition: Stable Temperature (F): 98.3 Ambulatory Status: Ambulatory Pulse (bpm): 72 Discharge Destination: Home Respiratory Rate (breaths/min): 18 Transportation: Private Auto Blood Pressure (mmHg): 152/88 Accompanied By: self Schedule Follow-up Appointment: Yes Clinical Summary of Care: Patient Declined Electronic Signature(s) Signed: 04/19/2021 4:37:51 PM By: Yevonne PaxEpps, Carrie RN Entered By: Yevonne PaxEpps, Carrie on 04/19/2021 09:42:03 Bermea, Raliegh ScarletGLENDA C. ([id number]) -------------------------------------------------------------------------------- Lower Extremity Assessment Details Patient Name: Pam BrownsBROWN, [name] Date of Service: 04/19/2021 8:45 AM Medical Record Number: [id number] Patient Account Number: [id number] Date of Birth/Sex: 12/01/1945 (76 y.o. Female) Treating RN: Yevonne PaxEpps, Carrie Primary Care Tylik Treese: Delbert HarnessBRISCOE, KIM  Other Clinician: Referring Anay Walter: Netta CedarsAMANATHAN, DEEPAK Treating Linsay Vogt/Extender: Rowan BlaseStone, Hoyt Weeks in Treatment: 0 Edema Assessment Assessed: [Left: No] [Right: No] Edema: [Left: Ye] [Right: s] Calf Left: Right: Point of Measurement: 29 cm From Medial Instep 28 cm Ankle Left: Right: Point of Measurement: 10 cm From Medial Instep 18 cm Knee To Floor Left: Right: From Medial Instep 43 cm Vascular Assessment Pulses: Dorsalis Pedis Palpable: [Right:Yes] Blood Pressure: Brachial: [Right:152] Ankle: [Right:Dorsalis Pedis: 140 0.92] Electronic Signature(s) Signed: 04/19/2021 4:37:51 PM By: Yevonne PaxEpps, Carrie RN Entered By: Yevonne PaxEpps, Carrie on 04/19/2021 09:11:49 Kloos, Natassia Marland Kitchen. ([id number]) -------------------------------------------------------------------------------- Multi Wound Chart Details Patient Name: Pam MillerBROWN, Akita C. Date of Service: 04/19/2021 8:45 AM Medical Record Number: [id number] Patient Account Number: [id number] Date of Birth/Sex: 05/27/1945 (76 y.o. Female) Treating RN: Yevonne PaxEpps, Carrie Primary Care Nealy Karapetian: Delbert HarnessBRISCOE, KIM Other Clinician: Referring Adonys Wildes: Netta CedarsAMANATHAN, DEEPAK Treating Remi Lopata/Extender: Rowan BlaseStone, Hoyt Weeks in Treatment: 0 Vital Signs Height(in): 60 Pulse(bpm): 72 Weight(lbs): 112 Blood Pressure(mmHg): 152/88 Body Mass Index(BMI): 21.9 Temperature(F): 98.3 Respiratory Rate(breaths/min): 18 Photos: [N/A:N/A] Wound Location: Right Lower Leg N/A N/A Wounding Event: Surgical Injury N/A N/A Primary Etiology: Dehisced Wound N/A N/A Date Acquired: 03/25/2021 N/A N/A Weeks of Treatment: 0 N/A N/A Wound Status: Open N/A N/A Wound Recurrence: No N/A N/A Measurements L x W x D (cm) 3x7x0.2 N/A N/A Area (cm) : 16.493 N/A N/A Volume (cm) : 3.299 N/A N/A Classification: Full Thickness Without Exposed N/A N/A Support Structures Exudate Amount: Medium N/A N/A Exudate Type: Serosanguineous N/A N/A Exudate Color: red, Lamagna N/A N/A Granulation Amount: Medium  (34-66%) N/A N/A Granulation Quality: Red, Pink N/A N/A Necrotic Amount: Medium (34-66%) N/A N/A Exposed Structures: Fat Layer (Subcutaneous Tissue): N/A N/A Yes Fascia: No Tendon: No Muscle: No Joint: No Bone: No Epithelialization: None N/A N/A Treatment Notes Electronic Signature(s) Signed: 04/19/2021 9:20:10 AM By: Yevonne PaxEpps, Carrie RN Entered By: Yevonne PaxEpps, Carrie on 04/19/2021 09:20:10 Pam MillerBROWN, Tyshika C. ([id number]) -------------------------------------------------------------------------------- Multi-Disciplinary Care Plan Details Patient Name: Pam MillerBROWN, Dezaria C. Date of Service: 04/19/2021 8:45 AM Medical Record Number: [id number] Patient Account Number: [id number] Date of Birth/Sex: 08/24/1945 (76 y.o. Female) Treating RN: Yevonne PaxEpps, Carrie Primary Care Toretto Tingler: Delbert HarnessBRISCOE, KIM Other Clinician: Referring Koki Buxton: Netta CedarsAMANATHAN, DEEPAK Treating Reshawn Ostlund/Extender: Rowan BlaseStone, Hoyt Weeks in Treatment: 0 Active Inactive Wound/Skin Impairment Nursing Diagnoses: Knowledge deficit related to ulceration/compromised skin integrity Goals: Patient/caregiver will verbalize understanding of skin care regimen Date Initiated: 04/19/2021 Target  Resolution Date: 05/17/2021 Goal Status: Active Ulcer/skin breakdown will have a volume reduction of 30% by week 4 Date Initiated: 04/19/2021 Target Resolution Date: 05/17/2021 Goal Status: Active Ulcer/skin breakdown will have a volume reduction of 50% by week 8 Date Initiated: 04/19/2021 Target Resolution Date: 06/17/2021 Goal Status: Active Ulcer/skin breakdown will have a volume reduction of 80% by week 12 Date Initiated: 04/19/2021 Target Resolution Date: 07/17/2021 Goal Status: Active Ulcer/skin breakdown will heal within 14 weeks Date Initiated: 04/19/2021 Target Resolution Date: 08/17/2021 Goal Status: Active Interventions: Assess patient/caregiver ability to obtain necessary supplies Assess patient/caregiver ability to perform ulcer/skin care regimen upon  admission and as needed Assess ulceration(s) every visit Notes: Electronic Signature(s) Signed: 04/19/2021 9:19:51 AM By: Yevonne Pax RN Entered By: Yevonne Pax on 04/19/2021 09:19:51 Rodd, Raliegh Scarlet ([id number]) -------------------------------------------------------------------------------- Pain Assessment Details Patient Name: ASLEAN, BISAILLON C. Date of Service: 04/19/2021 8:45 AM Medical Record Number: [id number] Patient Account Number: [id number] Date of Birth/Sex: [birth year] (76 y.o. Female) Treating RN: Yevonne Pax Primary Care Naara Kelty: Delbert Harness Other Clinician: Referring Taevion Sikora: Netta Cedars Treating Akari Defelice/Extender: Rowan Blase in Treatment: 0 Active Problems Location of Pain Severity and Description of Pain Patient Has Paino No Site Locations Pain Management and Medication Current Pain Management: Electronic Signature(s) Signed: 04/19/2021 4:37:51 PM By: Yevonne Pax RN Entered By: Yevonne Pax on 04/19/2021 08:50:42 Muns, Raliegh Scarlet ([id number]) -------------------------------------------------------------------------------- Patient/Caregiver Education Details Patient Name: Pam Miller Date of Service: 04/19/2021 8:45 AM Medical Record Number: [id number] Patient Account Number: [id number] Date of Birth/Gender: Feb 08, 1946 (76 y.o. Female) Treating RN: Yevonne Pax Primary Care Physician: Delbert Harness Other Clinician: Referring Physician: Netta Cedars Treating Physician/Extender: Rowan Blase in Treatment: 0 Education Assessment Education Provided To: Patient Education Topics Provided Wound/Skin Impairment: Methods: Explain/Verbal Responses: State content correctly Electronic Signature(s) Signed: 04/19/2021 4:37:51 PM By: Yevonne Pax RN Entered By: Yevonne Pax on 04/19/2021 09:40:35 Kimmel, Raliegh Scarlet ([id number]) -------------------------------------------------------------------------------- Wound Assessment Details Patient Name:  SIMI, BERMUDES C. Date of Service: 04/19/2021 8:45 AM Medical Record Number: [id number] Patient Account Number: [id number] Date of Birth/Sex: [birth year] (76 y.o. Female) Treating RN: Yevonne Pax Primary Care Neleh Muldoon: Delbert Harness Other Clinician: Referring Walden Statz: Netta Cedars Treating Raahil Ong/Extender: Rowan Blase in Treatment: 0 Wound Status Wound Number: 1 Primary Etiology: Dehisced Wound Wound Location: Right Lower Leg Wound Status: Open Wounding Event: Surgical Injury Date Acquired: 03/25/2021 Weeks Of Treatment: 0 Clustered Wound: No Photos Wound Measurements Length: (cm) 3 Width: (cm) 0.7 Depth: (cm) 0.2 Area: (cm) 1.649 Volume: (cm) 0.33 % Reduction in Area: 90% % Reduction in Volume: 90% Epithelialization: None Tunneling: No Undermining: No Wound Description Classification: Full Thickness Without Exposed Support Structu Exudate Amount: Medium Exudate Type: Serosanguineous Exudate Color: red, Drier res Foul Odor After Cleansing: No Slough/Fibrino Yes Wound Bed Granulation Amount: Medium (34-66%) Exposed Structure Granulation Quality: Red, Pink Fascia Exposed: No Necrotic Amount: Medium (34-66%) Fat Layer (Subcutaneous Tissue) Exposed: Yes Tendon Exposed: No Muscle Exposed: No Joint Exposed: No Bone Exposed: No Treatment Notes Wound #1 (Lower Leg) Wound Laterality: Right Cleanser Peri-Wound Care Topical Primary Dressing ANNABELLE, ALLCORN C. ([id number]) IODOFLEX 0.9% Cadexomer Iodine Pad Discharge Instruction: Apply Iodoflex to wound bed only as directed. Secondary Dressing Zetuvit Plus Silicone Border Dressing 4x4 (in/in) Secured With Compression Wrap Compression Stockings Add-Ons Electronic Signature(s) Signed: 04/19/2021 4:37:51 PM By: Yevonne Pax RN Entered By: Yevonne Pax on 04/19/2021 09:39:35 Innis, Raliegh Scarlet ([id number]) -------------------------------------------------------------------------------- Vitals Details Patient  Name: Pam Miller. Date of Service: 04/19/2021 8:45 AM Medical Record Number: [id number] Patient Account  Number: [id number] Date of Birth/Sex: 13-Dec-1945 (76 y.o. Female) Treating RN: Yevonne Pax Primary Care Amela Handley: Delbert Harness Other Clinician: Referring Jessieca Rhem: Netta Cedars Treating Angelyna Henderson/Extender: Rowan Blase in Treatment: 0 Vital Signs Time Taken: 08:50 Temperature (F): 98.3 Height (in): 60 Pulse (bpm): 72 Source: Stated Respiratory Rate (breaths/min): 18 Weight (lbs): 112 Blood Pressure (mmHg): 152/88 Source: Stated Reference Range: 80 - 120 mg / dl Body Mass Index (BMI): 21.9 Electronic Signature(s) Signed: 04/19/2021 4:37:51 PM By: Yevonne Pax RN Entered By: Yevonne Pax on 04/19/2021 08:51:12

## 2021-04-26 ENCOUNTER — Other Ambulatory Visit: Payer: Self-pay

## 2021-04-26 ENCOUNTER — Ambulatory Visit: Payer: Medicare Other | Admitting: Physician Assistant

## 2021-04-26 ENCOUNTER — Encounter: Payer: Medicare Other | Attending: Physician Assistant | Admitting: Physician Assistant

## 2021-04-26 DIAGNOSIS — Y839 Surgical procedure, unspecified as the cause of abnormal reaction of the patient, or of later complication, without mention of misadventure at the time of the procedure: Secondary | ICD-10-CM | POA: Diagnosis not present

## 2021-04-26 DIAGNOSIS — T8131XA Disruption of external operation (surgical) wound, not elsewhere classified, initial encounter: Secondary | ICD-10-CM | POA: Insufficient documentation

## 2021-04-26 DIAGNOSIS — E039 Hypothyroidism, unspecified: Secondary | ICD-10-CM | POA: Insufficient documentation

## 2021-04-26 DIAGNOSIS — L97812 Non-pressure chronic ulcer of other part of right lower leg with fat layer exposed: Secondary | ICD-10-CM | POA: Insufficient documentation

## 2021-04-26 NOTE — Progress Notes (Signed)
[name], [name] ([id number]) Visit Report for 04/26/2021 Arrival Information Details Patient Name: [name], [name]. Date of Service: 04/26/2021 2:00 PM Medical Record Number: [id number] Patient Account Number: [id number] Date of Birth/Sex: 30-Nov-1945 (76 y.o. F) Treating RN: Carlene Coria Primary Care Kumar Falwell: Suzanna Obey Other Clinician: Referring Mechelle Pates: Suzanna Obey Treating Charlene Detter/Extender: Skipper Cliche in Treatment: 1 Visit Information History Since Last Visit All ordered tests and consults were completed: No Patient Arrived: Ambulatory Added or deleted any medications: No Arrival Time: 14:00 Any new allergies or adverse reactions: No Accompanied By: husband Had a fall or experienced change in No Transfer Assistance: None activities of daily living that may affect Patient Identification Verified: Yes risk of falls: Secondary Verification Process Completed: Yes Signs or symptoms of abuse/neglect since last visito No Patient Requires Transmission-Based Precautions: No Hospitalized since last visit: No Patient Has Alerts: No Implantable device outside of the clinic excluding No cellular tissue based products placed in the center since last visit: Has Dressing in Place as Prescribed: Yes Pain Present Now: No Electronic Signature(s) Signed: 04/26/2021 4:20:37 PM By: Carlene Coria RN Entered By: Carlene Coria on 04/26/2021 14:03:53 [name] ([id number]) -------------------------------------------------------------------------------- Clinic Level of Care Assessment Details Patient Name: [name]. Date of Service: 04/26/2021 2:00 PM Medical Record Number: [id number] Patient Account Number: [id number] Date of Birth/Sex: 1945-09-27 (76 y.o. F) Treating RN: Carlene Coria Primary Care Supriya Beaston: Suzanna Obey Other Clinician: Referring Darrion Wyszynski: Suzanna Obey Treating Ollivander See/Extender: Skipper Cliche in Treatment: 1 Clinic Level of Care Assessment Items TOOL 4  Quantity Score X - Use when only an EandM is performed on FOLLOW-UP visit 1 0 ASSESSMENTS - Nursing Assessment / Reassessment X - Reassessment of Co-morbidities (includes updates in patient status) 1 10 X- 1 5 Reassessment of Adherence to Treatment Plan ASSESSMENTS - Wound and Skin Assessment / Reassessment X - Simple Wound Assessment / Reassessment - one wound 1 5 []  - 0 Complex Wound Assessment / Reassessment - multiple wounds []  - 0 Dermatologic / Skin Assessment (not related to wound area) ASSESSMENTS - Focused Assessment []  - Circumferential Edema Measurements - multi extremities 0 []  - 0 Nutritional Assessment / Counseling / Intervention []  - 0 Lower Extremity Assessment (monofilament, tuning fork, pulses) []  - 0 Peripheral Arterial Disease Assessment (using hand held doppler) ASSESSMENTS - Ostomy and/or Continence Assessment and Care []  - Incontinence Assessment and Management 0 []  - 0 Ostomy Care Assessment and Management (repouching, etc.) PROCESS - Coordination of Care X - Simple Patient / Family Education for ongoing care 1 15 []  - 0 Complex (extensive) Patient / Family Education for ongoing care []  - 0 Staff obtains Programmer, systems, Records, Test Results / Process Orders []  - 0 Staff telephones HHA, Nursing Homes / Clarify orders / etc []  - 0 Routine Transfer to another Facility (non-emergent condition) []  - 0 Routine Hospital Admission (non-emergent condition) []  - 0 New Admissions / Biomedical engineer / Ordering NPWT, Apligraf, etc. []  - 0 Emergency Hospital Admission (emergent condition) X- 1 10 Simple Discharge Coordination []  - 0 Complex (extensive) Discharge Coordination PROCESS - Special Needs []  - Pediatric / Minor Patient Management 0 []  - 0 Isolation Patient Management []  - 0 Hearing / Language / Visual special needs []  - 0 Assessment of Community assistance (transportation, D/C planning, etc.) []  - 0 Additional assistance / Altered  mentation []  - 0 Support Surface(s) Assessment (bed, cushion, seat, etc.) INTERVENTIONS - Wound Cleansing / Measurement [name], [name] ([id number]) X- 1 5 Simple Wound Cleansing -  one wound []  - 0 Complex Wound Cleansing - multiple wounds X- 1 5 Wound Imaging (photographs - any number of wounds) []  - 0 Wound Tracing (instead of photographs) X- 1 5 Simple Wound Measurement - one wound []  - 0 Complex Wound Measurement - multiple wounds INTERVENTIONS - Wound Dressings X - Small Wound Dressing one or multiple wounds 1 10 []  - 0 Medium Wound Dressing one or multiple wounds []  - 0 Large Wound Dressing one or multiple wounds []  - 0 Application of Medications - topical []  - 0 Application of Medications - injection INTERVENTIONS - Miscellaneous []  - External ear exam 0 []  - 0 Specimen Collection (cultures, biopsies, blood, body fluids, etc.) []  - 0 Specimen(s) / Culture(s) sent or taken to Lab for analysis []  - 0 Patient Transfer (multiple staff / Civil Service fast streamer / Similar devices) []  - 0 Simple Staple / Suture removal (25 or less) []  - 0 Complex Staple / Suture removal (26 or more) []  - 0 Hypo / Hyperglycemic Management (close monitor of Blood Glucose) []  - 0 Ankle / Brachial Index (ABI) - do not check if billed separately X- 1 5 Vital Signs Has the patient been seen at the hospital within the last three years: Yes Total Score: 75 Level Of Care: New/Established - Level 2 Electronic Signature(s) Signed: 04/26/2021 4:20:37 PM By: Carlene Coria RN Entered By: Carlene Coria on 04/26/2021 14:24:57 [name] ([id number]) -------------------------------------------------------------------------------- Encounter Discharge Information Details Patient Name: [name], [name] C. Date of Service: 04/26/2021 2:00 PM Medical Record Number: [id number] Patient Account Number: [id number] Date of Birth/Sex: 1945-07-30 (76 y.o. F) Treating RN: Carlene Coria Primary Care Reinhardt Licausi: Suzanna Obey  Other Clinician: Referring Camilia Caywood: Suzanna Obey Treating Mattie Nordell/Extender: Skipper Cliche in Treatment: 1 Encounter Discharge Information Items Post Procedure Vitals Discharge Condition: Stable Temperature (F): 98.6 Ambulatory Status: Ambulatory Pulse (bpm): 88 Discharge Destination: Home Respiratory Rate (breaths/min): 18 Transportation: Private Auto Blood Pressure (mmHg): 127/72 Accompanied By: husband Schedule Follow-up Appointment: Yes Clinical Summary of Care: Patient Declined Electronic Signature(s) Signed: 04/26/2021 4:20:37 PM By: Carlene Coria RN Entered By: Carlene Coria on 04/26/2021 14:31:01 Palos, Elon Mathews ([id number]) -------------------------------------------------------------------------------- Lower Extremity Assessment Details Patient Name: [name], [name] C. Date of Service: 04/26/2021 2:00 PM Medical Record Number: [id number] Patient Account Number: [id number] Date of Birth/Sex: 1945-08-07 (76 y.o. F) Treating RN: Carlene Coria Primary Care Jalesha Plotz: Suzanna Obey Other Clinician: Referring Yesha Muchow: Suzanna Obey Treating Reshma Hoey/Extender: Skipper Cliche in Treatment: 1 Edema Assessment Assessed: [Left: No] [Right: No] Edema: [Left: Ye] [Right: s] Calf Left: Right: Point of Measurement: 29 cm From Medial Instep 30 cm Ankle Left: Right: Point of Measurement: 10 cm From Medial Instep 18 cm Vascular Assessment Pulses: Dorsalis Pedis Palpable: [Right:Yes] Electronic Signature(s) Signed: 04/26/2021 4:20:37 PM By: Carlene Coria RN Entered By: Carlene Coria on 04/26/2021 14:09:15 Mcquiston, Elon Mathews ([id number]) -------------------------------------------------------------------------------- Multi Wound Chart Details Patient Name: [name]. Date of Service: 04/26/2021 2:00 PM Medical Record Number: [id number] Patient Account Number: [id number] Date of Birth/Sex: February 10, 1946 (76 y.o. F) Treating RN: Carlene Coria Primary Care Sherly Brodbeck: Suzanna Obey Other  Clinician: Referring Jamyla Ard: Suzanna Obey Treating Vardaan Depascale/Extender: Skipper Cliche in Treatment: 1 Vital Signs Height(in): 60 Pulse(bpm): 88 Weight(lbs): 112 Blood Pressure(mmHg): 127/72 Body Mass Index(BMI): 21.9 Temperature(F): 98.6 Respiratory Rate(breaths/min): 18 Photos: [N/A:N/A] Wound Location: Right Lower Leg N/A N/A Wounding Event: Surgical Injury N/A N/A Primary Etiology: Dehisced Wound N/A N/A Date Acquired: 03/25/2021 N/A N/A Weeks of Treatment: 1 N/A N/A Wound Status: Open N/A N/A Wound Recurrence:  No N/A N/A Measurements L x W x D (cm) 2x0.5x0.2 N/A N/A Classification: Full Thickness Without Exposed N/A N/A Support Structures Exudate Amount: Medium N/A N/A Exudate Type: Serosanguineous N/A N/A Exudate Color: red, Wisham N/A N/A Granulation Amount: Medium (34-66%) N/A N/A Granulation Quality: Red, Pink N/A N/A Necrotic Amount: Medium (34-66%) N/A N/A Exposed Structures: Fat Layer (Subcutaneous Tissue): N/A N/A Yes Fascia: No Tendon: No Muscle: No Joint: No Bone: No Epithelialization: None N/A N/A Treatment Notes Electronic Signature(s) Signed: 04/26/2021 4:20:37 PM By: Carlene Coria RN Entered By: Carlene Coria on 04/26/2021 14:10:10 [name] ([id number]) -------------------------------------------------------------------------------- [place] Details Patient Name: [name], [name]. Date of Service: 04/26/2021 2:00 PM Medical Record Number: [id number] Patient Account Number: [id number] Date of Birth/Sex: 01/06/1946 (76 y.o. F) Treating RN: Carlene Coria Primary Care Michaelann Gunnoe: Suzanna Obey Other Clinician: Referring Anneth Brunell: Suzanna Obey Treating Kolbie Clarkston/Extender: Skipper Cliche in Treatment: 1 Active Inactive Wound/Skin Impairment Nursing Diagnoses: Knowledge deficit related to ulceration/compromised skin integrity Goals: Patient/caregiver will verbalize understanding of skin care regimen Date Initiated:  04/19/2021 Target Resolution Date: 05/17/2021 Goal Status: Active Ulcer/skin breakdown will have a volume reduction of 30% by week 4 Date Initiated: 04/19/2021 Target Resolution Date: 05/17/2021 Goal Status: Active Ulcer/skin breakdown will have a volume reduction of 50% by week 8 Date Initiated: 04/19/2021 Target Resolution Date: 06/17/2021 Goal Status: Active Ulcer/skin breakdown will have a volume reduction of 80% by week 12 Date Initiated: 04/19/2021 Target Resolution Date: 07/17/2021 Goal Status: Active Ulcer/skin breakdown will heal within 14 weeks Date Initiated: 04/19/2021 Target Resolution Date: 08/17/2021 Goal Status: Active Interventions: Assess patient/caregiver ability to obtain necessary supplies Assess patient/caregiver ability to perform ulcer/skin care regimen upon admission and as needed Assess ulceration(s) every visit Notes: Electronic Signature(s) Signed: 04/26/2021 4:20:37 PM By: Carlene Coria RN Entered By: Carlene Coria on 04/26/2021 14:09:28 Saintil, Elon Mathews ([id number]) -------------------------------------------------------------------------------- Pain Assessment Details Patient Name: [name], [name] C. Date of Service: 04/26/2021 2:00 PM Medical Record Number: [id number] Patient Account Number: [id number] Date of Birth/Sex: 1945-10-05 (76 y.o. F) Treating RN: Carlene Coria Primary Care Opaline Reyburn: Suzanna Obey Other Clinician: Referring Kalea Perine: Suzanna Obey Treating Polina Burmaster/Extender: Skipper Cliche in Treatment: 1 Active Problems Location of Pain Severity and Description of Pain Patient Has Paino No Site Locations Pain Management and Medication Current Pain Management: Electronic Signature(s) Signed: 04/26/2021 4:20:37 PM By: Carlene Coria RN Entered By: Carlene Coria on 04/26/2021 14:04:20 [name] ([id number]) -------------------------------------------------------------------------------- Patient/Caregiver Education Details Patient Name: [name], [name] Date of Service: 04/26/2021 2:00 PM Medical Record Number: [id number] Patient Account Number: [id number] Date of Birth/Gender: 11-19-1945 (76 y.o. F) Treating RN: Carlene Coria Primary Care Physician: Suzanna Obey Other Clinician: Referring Physician: Suzanna Obey Treating Physician/Extender: Skipper Cliche in Treatment: 1 Education Assessment Education Provided To: Patient Education Topics Provided Wound/Skin Impairment: Methods: Explain/Verbal Responses: State content correctly Electronic Signature(s) Signed: 04/26/2021 4:20:37 PM By: Carlene Coria RN Entered By: Carlene Coria on 04/26/2021 14:27:55 Alcocer, Elon Mathews ([id number]) -------------------------------------------------------------------------------- Wound Assessment Details Patient Name: [name], [name] C. Date of Service: 04/26/2021 2:00 PM Medical Record Number: [id number] Patient Account Number: [id number] Date of Birth/Sex: 1945-07-08 (76 y.o. F) Treating RN: Carlene Coria Primary Care Neithan Day: Suzanna Obey Other Clinician: Referring Tanairi Cypert: Suzanna Obey Treating Odean Fester/Extender: Skipper Cliche in Treatment: 1 Wound Status Wound Number: 1 Primary Etiology: Dehisced Wound Wound Location: Right Lower Leg Wound Status: Open Wounding Event: Surgical Injury Date Acquired: 03/25/2021 Weeks Of Treatment: 1 Clustered Wound: No Photos Wound Measurements Length: (cm) 2 Width: (cm)  0.5 Depth: (cm) 0.2 % Reduction in Area: % Reduction in Volume: Epithelialization: None Tunneling: No Undermining: No Wound Description Classification: Full Thickness Without Exposed Support Structu Exudate Amount: Medium Exudate Type: Serosanguineous Exudate Color: red, Traber res Foul Odor After Cleansing: No Slough/Fibrino Yes Wound Bed Granulation Amount: Medium (34-66%) Exposed Structure Granulation Quality: Red, Pink Fascia Exposed: No Necrotic Amount: Medium (34-66%) Fat Layer (Subcutaneous Tissue) Exposed:  Yes Tendon Exposed: No Muscle Exposed: No Joint Exposed: No Bone Exposed: No Treatment Notes Wound #1 (Lower Leg) Wound Laterality: Right Cleanser Peri-Wound Care Topical Primary Dressing [name], [name] C. ([id number]) IODOFLEX 0.9% Cadexomer Iodine Pad Discharge Instruction: Apply Iodoflex to wound bed only as directed. Secondary Dressing (SILICONE BORDER) Zetuvit Plus SILICONE BORDER Dressing 4x4 (in/in) Secured With Compression Wrap Compression Stockings Add-Ons Electronic Signature(s) Signed: 04/26/2021 4:20:37 PM By: Carlene Coria RN Entered By: Carlene Coria on 04/26/2021 14:08:22 Ostrosky, Elon Mathews ([id number]) -------------------------------------------------------------------------------- [place] Details Patient Name: [name], [name] C. Date of Service: 04/26/2021 2:00 PM Medical Record Number: [id number] Patient Account Number: [id number] Date of Birth/Sex: January 19, 1946 (76 y.o. F) Treating RN: Carlene Coria Primary Care Margaret Staggs: Suzanna Obey Other Clinician: Referring Madysen Faircloth: Suzanna Obey Treating Carolyn Maniscalco/Extender: Skipper Cliche in Treatment: 1 Vital Signs Time Taken: 14:03 Temperature (F): 98.6 Height (in): 60 Pulse (bpm): 88 Weight (lbs): 112 Respiratory Rate (breaths/min): 18 Body Mass Index (BMI): 21.9 Blood Pressure (mmHg): 127/72 Reference Range: 80 - 120 mg / dl Electronic Signature(s) Signed: 04/26/2021 4:20:37 PM By: Carlene Coria RN Entered By: Carlene Coria on 04/26/2021 14:04:12

## 2021-04-26 NOTE — Progress Notes (Addendum)
[name], Pam C. ([id number]) Visit Report for 04/26/2021 Chief Complaint Document Details Patient Name: [name], Pam C. Date of Service: 04/26/2021 2:00 PM Medical Record Number: [id number] Patient Account Number: [id number] Date of Birth/Sex: 11/23/1945 (76 y.o. F) Treating RN: Yevonne PaxEpps, Carrie Primary Care Provider: Delbert HarnessBriscoe, Kim Other Clinician: Referring Provider: Delbert HarnessBriscoe, Kim Treating Provider/Extender: Rowan BlaseStone, Abass Misener Weeks in Treatment: 1 Information Obtained from: Patient Chief Complaint Right LE surgical ulcer Electronic Signature(s) Signed: 04/26/2021 2:09:38 PM By: Lenda KelpStone III, Vonita Calloway PA-C Entered By: Lenda KelpStone III, Johndaniel Catlin on 04/26/2021 14:09:38 Tomberlin, Pam ScarletGLENDA C. ([id number]) -------------------------------------------------------------------------------- Debridement Details Patient Name: [name], Pam C. Date of Service: 04/26/2021 2:00 PM Medical Record Number: [id number] Patient Account Number: [id number] Date of Birth/Sex: 08/22/1945 (76 y.o. F) Treating RN: Yevonne PaxEpps, Carrie Primary Care Provider: Delbert HarnessBriscoe, Kim Other Clinician: Referring Provider: Delbert HarnessBriscoe, Kim Treating Provider/Extender: Rowan BlaseStone, Kylie Gros Weeks in Treatment: 1 Debridement Performed for Wound #1 Right Lower Leg Assessment: Performed By: Physician Nelida MeuseStone, Camiyah Friberg E., PA-C Debridement Type: Debridement Level of Consciousness (Pre- Awake and Alert procedure): Pre-procedure Verification/Time Out Yes - 14:25 Taken: Start Time: 14:25 Pain Control: Lidocaine 4% Topical Solution Total Area Debrided (L x W): 2 (cm) x 0.5 (cm) = 1 (cm) Tissue and other material Viable, Non-Viable, Slough, Subcutaneous, Biofilm, Slough debrided: Level: Skin/Subcutaneous Tissue Debridement Description: Excisional Instrument: Curette Bleeding: Minimum Hemostasis Achieved: Pressure End Time: 14:28 Procedural Pain: 0 Post Procedural Pain: 0 Response to Treatment: Procedure was tolerated well Level of Consciousness (Post- Awake and Alert procedure): Post  Debridement Measurements of Total Wound Length: (cm) 2 Width: (cm) 0.5 Depth: (cm) 0.2 Volume: (cm) 0.157 Character of Wound/Ulcer Post Debridement: Improved Post Procedure Diagnosis Same as Pre-procedure Electronic Signature(s) Signed: 04/26/2021 4:20:37 PM By: Yevonne PaxEpps, Carrie RN Signed: 04/26/2021 7:37:05 PM By: Lenda KelpStone III, Dorrien Grunder PA-C Entered By: Yevonne PaxEpps, Carrie on 04/26/2021 14:27:21 Damaso, Pam ScarletGLENDA C. ([id number]) -------------------------------------------------------------------------------- HPI Details Patient Name: [name], Pam C. Date of Service: 04/26/2021 2:00 PM Medical Record Number: [id number] Patient Account Number: [id number] Date of Birth/Sex: 07/11/1945 (76 y.o. F) Treating RN: Yevonne PaxEpps, Carrie Primary Care Provider: Delbert HarnessBriscoe, Kim Other Clinician: Referring Provider: Delbert HarnessBriscoe, Kim Treating Provider/Extender: Rowan BlaseStone, Claus Silvestro Weeks in Treatment: 1 History of Present Illness HPI Description: 04/19/2021 upon evaluation today patient appears to be doing well with regard to her wound all things considered. She is actually been [place] 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 The good news is that margins were considered to be completely clear post the procedure. 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 This surgery was performed on 03/25/2021 and mainly that they have been using Neosporin for the time being. 04/26/2021 upon evaluation today patient has been doing actually quite well with regard to her wound especially just 1 weeks time. I am actually very pleased with where we stand today. There does not appear to be any evidence of active infection locally or systemically which is great news. No fevers, chills, nausea, vomiting, or diarrhea. Electronic  Signature(s) Signed: 04/26/2021 2:32:14 PM By: Lenda KelpStone III, Gearald Stonebraker PA-C Entered By: Lenda KelpStone III, Naseer Hearn on 04/26/2021 14:32:14 [name], Pam C. ([id number]) -------------------------------------------------------------------------------- Physical Exam Details Patient Name: [name], Pam C. Date of Service: 04/26/2021 2:00 PM Medical Record Number: [id number] Patient Account Number: [id number] Date of Birth/Sex: 04/28/1945 (76 y.o. F) Treating RN: Yevonne PaxEpps, Carrie Primary Care Provider: Delbert HarnessBriscoe, Kim  Other Clinician: Referring Provider: Delbert Harness Treating Provider/Extender: Rowan Blase in Treatment: 1 Constitutional Well-nourished and well-hydrated in no acute distress. Respiratory normal breathing without difficulty. Psychiatric this patient is able to make decisions and demonstrates good insight into disease process. Alert and Oriented x 3. pleasant and cooperative. Notes Patient's wound is showing signs of excellent granulation and epithelization at this point. The good news is she seems to be healing quite nicely and overall I am extremely pleased with where we stand today. No fevers, chills, nausea, vomiting, or diarrhea. Electronic Signature(s) Signed: 04/26/2021 2:32:49 PM By: Lenda Kelp PA-C Entered By: Lenda Kelp on 04/26/2021 14:32:49 [name] ([id number]) -------------------------------------------------------------------------------- Physician Orders Details Patient Name: [name], [name]. Date of Service: 04/26/2021 2:00 PM Medical Record Number: [id number] Patient Account Number: [id number] Date of Birth/Sex: Dec 18, 1945 (76 y.o. F) Treating RN: Yevonne Pax Primary Care Provider: Delbert Harness Other Clinician: Referring Provider: Delbert Harness Treating Provider/Extender: Rowan Blase in Treatment: 1 Verbal / Phone Orders: No Diagnosis Coding ICD-10 Coding Code Description T81.31XA Disruption of external operation (surgical) wound, not elsewhere classified,  initial encounter L97.812 Non-pressure chronic ulcer of other part of right lower leg with fat layer exposed Follow-up Appointments o Return Appointment in 1 week. Bathing/ Shower/ Hygiene o May shower; gently cleanse wound with antibacterial soap, rinse and pat dry prior to dressing wounds Edema Control - Lymphedema / Segmental Compressive Device / Other o Elevate, Exercise Daily and Avoid Standing for Long Periods of Time. o Elevate legs to the level of the heart and pump ankles as often as possible o Elevate leg(s) parallel to the floor when sitting. Wound Treatment Wound #1 - Lower Leg Wound Laterality: Right Primary Dressing: IODOFLEX 0.9% Cadexomer Iodine Pad 3 x Per Day/30 Days Discharge Instructions: Apply Iodoflex to wound bed only as directed. Secondary Dressing: (SILICONE BORDER) Zetuvit Plus SILICONE BORDER Dressing 4x4 (in/in) (Generic) 3 x Per Day/30 Days Electronic Signature(s) Signed: 04/26/2021 4:20:37 PM By: Yevonne Pax RN Signed: 04/26/2021 7:37:05 PM By: Lenda Kelp PA-C Entered By: Yevonne Pax on 04/26/2021 14:24:27 Bacci, Pam Scarlet ([id number]) -------------------------------------------------------------------------------- Problem List Details Patient Name: [name], FLITTON. Date of Service: 04/26/2021 2:00 PM Medical Record Number: [id number] Patient Account Number: [id number] Date of Birth/Sex: 1945/10/20 (76 y.o. F) Treating RN: Yevonne Pax Primary Care Provider: Delbert Harness Other Clinician: Referring Provider: Delbert Harness Treating Provider/Extender: Rowan Blase in Treatment: 1 Active Problems ICD-10 Encounter Code Description Active Date MDM Diagnosis T81.31XA Disruption of external operation (surgical) wound, not elsewhere 04/19/2021 No Yes classified, initial encounter L97.812 Non-pressure chronic ulcer of other part of right lower leg with fat layer 04/19/2021 No Yes exposed Inactive Problems Resolved Problems Electronic  Signature(s) Signed: 04/26/2021 2:09:27 PM By: Lenda Kelp PA-C Entered By: Lenda Kelp on 04/26/2021 14:09:26 Nadeem, Pam Scarlet ([id number]) -------------------------------------------------------------------------------- Progress Note Details Patient Name: [name]. Date of Service: 04/26/2021 2:00 PM Medical Record Number: [id number] Patient Account Number: [id number] Date of Birth/Sex: 1946-02-14 (76 y.o. F) Treating RN: Yevonne Pax Primary Care Provider: Delbert Harness Other Clinician: Referring Provider: Delbert Harness Treating Provider/Extender: Rowan Blase in Treatment: 1 Subjective Chief Complaint Information obtained from Patient Right LE surgical ulcer History of Present Illness (HPI) 04/19/2021 upon evaluation today patient appears to be doing well with regard to her wound all things considered. She is actually been to the dermatology and skin surgery center in [place]. She did have a surgical excision/Mohs surgery on the right medial calf due to  squamous cell carcinoma. The good news is that margins were considered to be completely clear post the procedure. 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 This surgery was performed on 03/25/2021 and mainly that they have been using Neosporin for the time being. 04/26/2021 upon evaluation today patient has been doing actually quite well with regard to her wound especially just 1 weeks time. I am actually very pleased with where we stand today. There does not appear to be any evidence of active infection locally or systemically which is great news. No fevers, chills, nausea, vomiting, or diarrhea. Objective Constitutional Well-nourished and well-hydrated in no acute distress. Vitals Time Taken: 2:03 PM, Height: 60 in, Weight: 112 lbs, BMI: 21.9, Temperature: 98.6 F, Pulse: 88 bpm, Respiratory Rate: 18  breaths/min, Blood Pressure: 127/72 mmHg. Respiratory normal breathing without difficulty. Psychiatric this patient is able to make decisions and demonstrates good insight into disease process. Alert and Oriented x 3. pleasant and cooperative. General Notes: Patient's wound is showing signs of excellent granulation and epithelization at this point. The good news is she seems to be healing quite nicely and overall I am extremely pleased with where we stand today. No fevers, chills, nausea, vomiting, or diarrhea. Integumentary (Hair, Skin) Wound #1 status is Open. Original cause of wound was Surgical Injury. The date acquired was: 03/25/2021. The wound has been in treatment 1 weeks. The wound is located on the Right Lower Leg. The wound measures 2cm length x 0.5cm width x 0.2cm depth. There is Fat Layer (Subcutaneous Tissue) exposed. There is no tunneling or undermining noted. There is a medium amount of serosanguineous drainage noted. There is medium (34-66%) red, pink granulation within the wound bed. There is a medium (34-66%) amount of necrotic tissue within the wound bed. Assessment Active Problems ICD-10 Disruption of external operation (surgical) wound, not elsewhere classified, initial encounter Non-pressure chronic ulcer of other part of right lower leg with fat layer exposed [name], Pam C. ([id number]) Procedures Wound #1 Pre-procedure diagnosis of Wound #1 is a Dehisced Wound located on the Right Lower Leg . There was a Excisional Skin/Subcutaneous Tissue Debridement with a total area of 1 sq cm performed by Nelida Meuse., PA-C. With the following instrument(s): Curette to remove Viable and Non-Viable tissue/material. Material removed includes Subcutaneous Tissue, Slough, and Biofilm after achieving pain control using Lidocaine 4% Topical Solution. No specimens were taken. A time out was conducted at 14:25, prior to the start of the procedure. A Minimum amount of bleeding was  controlled with Pressure. The procedure was tolerated well with a pain level of 0 throughout and a pain level of 0 following the procedure. Post Debridement Measurements: 2cm length x 0.5cm width x 0.2cm depth; 0.157cm^3 volume. Character of Wound/Ulcer Post Debridement is improved. Post procedure Diagnosis Wound #1: Same as Pre-Procedure Plan Follow-up Appointments: Return Appointment in 1 week. Bathing/ Shower/ Hygiene: May shower; gently cleanse wound with antibacterial soap, rinse and pat dry prior to dressing wounds Edema Control - Lymphedema / Segmental Compressive Device / Other: Elevate, Exercise Daily and Avoid Standing for Long Periods of Time. Elevate legs to the level of the heart and pump ankles as often as possible Elevate leg(s) parallel to the floor when sitting. WOUND #1: - Lower Leg Wound Laterality: Right Primary Dressing: IODOFLEX 0.9% Cadexomer Iodine Pad 3 x Per Day/30 Days Discharge Instructions: Apply Iodoflex to wound  bed only as directed. Secondary Dressing: (SILICONE BORDER) Zetuvit Plus SILICONE BORDER Dressing 4x4 (in/in) (Generic) 3 x Per Day/30 Days 1. Would recommend currently that we going continue with the Iodoflex which I think is probably still the best way to go at this point. We are currently using the Iodoflex. 2. I am also going to recommend that we have the patient continue to monitor for any signs of worsening or infection. Obviously if anything changes she should let me know soon as possible. Right now things are doing awesome. 3. She will continue with the border foam dressing to cover which has done excellent as well. We will see patient back for reevaluation in 1 week here in the clinic. If anything worsens or changes patient will contact our office for additional recommendations. Electronic Signature(s) Signed: 04/26/2021 2:33:22 PM By: Lenda Kelp PA-C Entered By: Lenda Kelp on 04/26/2021 14:33:21 Dearman, Pam Scarlet  ([id number]) -------------------------------------------------------------------------------- SuperBill Details Patient Name: [name], SEIFERT. Date of Service: 04/26/2021 Medical Record Number: [id number] Patient Account Number: [id number] Date of Birth/Sex: 04-16-1945 (76 y.o. F) Treating RN: Yevonne Pax Primary Care Provider: Delbert Harness Other Clinician: Referring Provider: Delbert Harness Treating Provider/Extender: Rowan Blase in Treatment: 1 Diagnosis Coding ICD-10 Codes Code Description T81.31XA Disruption of external operation (surgical) wound, not elsewhere classified, initial encounter L97.812 Non-pressure chronic ulcer of other part of right lower leg with fat layer exposed Facility Procedures CPT4 Code: [id number] Description: [phone] - WOUND CARE VISIT-LEV 2 EST PT Modifier: Quantity: 1 CPT4 Code: [id number] Description: 11042 - DEB SUBQ TISSUE 20 SQ CM/< Modifier: Quantity: 1 CPT4 Code: Description: ICD-10 Diagnosis Description L97.812 Non-pressure chronic ulcer of other part of right lower leg with fat layer Modifier: exposed Quantity: Physician Procedures CPT4 Code: [id number] Description: 11042 - WC PHYS SUBQ TISS 20 SQ CM Modifier: Quantity: 1 CPT4 Code: Description: ICD-10 Diagnosis Description L97.812 Non-pressure chronic ulcer of other part of right lower leg with fat laye Modifier: r exposed Quantity: Electronic Signature(s) Signed: 04/26/2021 2:33:29 PM By: Lenda Kelp PA-C Entered By: Lenda Kelp on 04/26/2021 14:33:29

## 2021-05-03 ENCOUNTER — Other Ambulatory Visit: Payer: Self-pay

## 2021-05-03 ENCOUNTER — Encounter (HOSPITAL_BASED_OUTPATIENT_CLINIC_OR_DEPARTMENT_OTHER): Payer: Medicare Other | Admitting: Internal Medicine

## 2021-05-03 DIAGNOSIS — L97812 Non-pressure chronic ulcer of other part of right lower leg with fat layer exposed: Secondary | ICD-10-CM | POA: Diagnosis not present

## 2021-05-03 DIAGNOSIS — T8131XA Disruption of external operation (surgical) wound, not elsewhere classified, initial encounter: Secondary | ICD-10-CM

## 2021-05-10 ENCOUNTER — Other Ambulatory Visit: Payer: Self-pay

## 2021-05-10 ENCOUNTER — Encounter: Payer: Medicare Other | Admitting: Physician Assistant

## 2021-05-10 DIAGNOSIS — T8131XA Disruption of external operation (surgical) wound, not elsewhere classified, initial encounter: Secondary | ICD-10-CM | POA: Diagnosis not present

## 2021-05-10 NOTE — Progress Notes (Addendum)
VARONICA, SIHARATH ([id number]) ?Visit Report for 05/10/2021 ?Chief Complaint Document Details ?Patient Name: [name], [name] ?Date of Service: 05/10/2021 2:00 PM ?Medical Record Number: [id number] ?Patient Account Number: [id number] ?Date of Birth/Sex: 07/02/1945 (76 y.o. F) ?Treating RN: Yevonne Pax ?Primary Care Provider: Delbert Harness Other Clinician: ?Referring Provider: Delbert Harness ?Treating Provider/Extender: Allen Derry ?Weeks in Treatment: 3 ?Information Obtained from: Patient ?Chief Complaint ?Right LE surgical ulcer ?Electronic Signature(s) ?Signed: 05/10/2021 2:10:26 PM By: Lenda Kelp PA-C ?Entered By: Lenda Kelp on 05/10/2021 14:10:25 ?[name], [name] ([id number]) ?-------------------------------------------------------------------------------- ?HPI Details ?Patient Name: [name], [name] ?Date of Service: 05/10/2021 2:00 PM ?Medical Record Number: [id number] ?Patient Account Number: [id number] ?Date of Birth/Sex: 1945/03/26 (76 y.o. F) ?Treating RN: Yevonne Pax ?Primary Care Provider: Delbert Harness Other Clinician: ?Referring Provider: Delbert Harness ?Treating Provider/Extender: Allen Derry ?Weeks in Treatment: 3 ?History of Present Illness ?HPI Description: 04/19/2021 upon evaluation today patient appears to be doing well with regard to her wound all things considered. She is ?actually been to the dermatology and skin surgery center in [place]. She did have a surgical excision/Mohs surgery on the right medial calf ?due to squamous cell carcinoma. The good news is that margins were considered to be completely clear post the procedure. 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 ?This surgery was performed on 03/25/2021 and mainly that they have been using Neosporin for the time being. ?04/26/2021 upon evaluation today patient has been doing actually quite well with regard to her wound  especially just 1 weeks time. I am actually ?very pleased with where we stand today. There does not appear to be any evidence of active infection locally or systemically which is great news. ?No fevers, chills, nausea, vomiting, or diarrhea. ?3/14; patient presents for follow-up. She has been unable to do dressing changes and has been keeping the area covered. She states that her cat ?stole her Iodoflex. She currently denies signs of infection. ?05/10/2021 upon evaluation patient appears to be doing well with regard to her wound. In fact this appears to be completely healed which is great ?news. Fortunately I do not see any signs of active infection locally nor systemically which is great news. ?Electronic Signature(s) ?Signed: 05/10/2021 2:51:37 PM By: Lenda Kelp PA-C ?Entered By: Lenda Kelp on 05/10/2021 14:51:37 ?NARCISA, GANESH ([id number]) ?-------------------------------------------------------------------------------- ?Physical Exam Details ?Patient Name: [name], [name] ?Date of Service: 05/10/2021 2:00 PM ?Medical Record Number: [id number] ?Patient Account Number: [id number] ?Date of Birth/Sex: 1945/05/05 (76 y.o. F) ?Treating RN: Yevonne Pax ?Primary Care Provider: Delbert Harness Other Clinician: ?Referring Provider: Delbert Harness ?Treating Provider/Extender: Allen Derry ?Weeks in Treatment: 3 ?Constitutional ?Well-nourished and well-hydrated in no acute distress. ?Respiratory ?normal breathing without difficulty. ?Psychiatric ?this patient is able to make decisions and demonstrates good insight into disease process. Alert and Oriented x 3. pleasant and cooperative. ?Notes ?Patient's wound bed showed evidence of excellent epithelization everything is completely closed and overall I am extremely pleased with where ?we stand today. I do not see any signs of active infection locally nor systemically. ?Electronic Signature(s) ?Signed: 05/10/2021 2:51:57 PM By: Lenda Kelp PA-C ?Entered By: Lenda Kelp  on 05/10/2021 14:51:57 ?[name], [name] ([id number]) ?-------------------------------------------------------------------------------- ?Physician Orders Details ?Patient Name: [name], [name] ?Date of Service: 05/10/2021 2:00 PM ?Medical Record Number: [id number] ?Patient Account Number: [id number] ?Date of Birth/Sex: 12/13/1945 (76 y.o. F) ?Treating RN:  Epps, Lyla Son ?Primary Care Provider: Delbert Harness Other Clinician: ?Referring Provider: Delbert Harness ?Treating Provider/Extender: Allen Derry ?Weeks in Treatment: 3 ?Verbal / Phone Orders: No ?Diagnosis Coding ?ICD-10 Coding ?Code Description ?T81.31XA Disruption of external operation (surgical) wound, not elsewhere classified, initial encounter ?J57.017 Non-pressure chronic ulcer of other part of right lower leg with fat layer exposed ?Discharge From [facility] Services ?o Discharge from Wound Care Center Treatment Complete - apply protective covering times 1 week ?Electronic Signature(s) ?Signed: 05/10/2021 4:35:36 PM By: Yevonne Pax RN ?Signed: 05/11/2021 11:06:47 AM By: Lenda Kelp PA-C ?Entered ByYevonne Pax on 05/10/2021 14:29:08 ?[name], [name] ([id number]) ?-------------------------------------------------------------------------------- ?Problem List Details ?Patient Name: [name], [name] ?Date of Service: 05/10/2021 2:00 PM ?Medical Record Number: [id number] ?Patient Account Number: [id number] ?Date of Birth/Sex: Apr 30, 1945 (76 y.o. F) ?Treating RN: Yevonne Pax ?Primary Care Provider: Delbert Harness Other Clinician: ?Referring Provider: Delbert Harness ?Treating Provider/Extender: Allen Derry ?Weeks in Treatment: 3 ?Active Problems ?ICD-10 ?Encounter ?Code Description Active Date MDM ?Diagnosis ?T81.31XA Disruption of external operation (surgical) wound, not elsewhere 04/19/2021 No Yes ?classified, initial encounter ?Q33.354 Non-pressure chronic ulcer of other part of right lower leg with fat layer 04/19/2021 No Yes ?exposed ?Inactive Problems ?Resolved  Problems ?Electronic Signature(s) ?Signed: 05/10/2021 2:10:17 PM By: Lenda Kelp PA-C ?Entered By: Lenda Kelp on 05/10/2021 14:10:17 ?[name], [name] ([id number]) ?-------------------------------------------------------------------------------- ?Progress Note Details ?Patient Name: [name], [name] ?Date of Service: 05/10/2021 2:00 PM ?Medical Record Number: [id number] ?Patient Account Number: [id number] ?Date of Birth/Sex: [birth year] (76 y.o. F) ?Treating RN: Yevonne Pax ?Primary Care Provider: Delbert Harness Other Clinician: ?Referring Provider: Delbert Harness ?Treating Provider/Extender: Allen Derry ?Weeks in Treatment: 3 ?Subjective ?Chief Complaint ?Information obtained from Patient ?Right LE surgical ulcer ?History of Present Illness (HPI) ?04/19/2021 upon evaluation today patient appears to be doing well with regard to her wound all things considered. She is actually been to the ?dermatology and skin surgery center in [place]. She did have a surgical excision/Mohs surgery on the right medial calf due to squamous cell ?carcinoma. The good news is that margins were considered to be completely clear post the procedure. 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 This surgery was ?performed on 03/25/2021 and mainly that they have been using Neosporin for the time being. ?04/26/2021 upon evaluation today patient has been doing actually quite well with regard to her wound especially just 1 weeks time. I am actually ?very pleased with where we stand today. There does not appear to be any evidence of active infection locally or systemically which is great news. ?No fevers, chills, nausea, vomiting, or diarrhea. ?3/14; patient presents for follow-up. She has been unable to do dressing changes and has been keeping the area covered. She states that her cat ?stole her Iodoflex. She currently denies  signs of infection. ?05/10/2021 upon evaluation patient appears to be doing well with regard to her wound. In fact this appears to be completely healed which is great ?news. Fortunately I do not see any signs of active in

## 2021-05-10 NOTE — Progress Notes (Signed)
Pam, Mathews ([id number]) ?Visit Report for 05/10/2021 ?Arrival Information Details ?Patient Name: [name], [name] ?Date of Service: 05/10/2021 2:00 PM ?Medical Record Number: [id number] ?Patient Account Number: [id number] ?Date of Birth/Sex: March 06, 1945 (76 y.o. F) ?Treating RN: Yevonne Pax ?Primary Care Tenleigh Byer: Delbert Harness Other Clinician: ?Referring Paxtyn Wisdom: Delbert Harness ?Treating Jahliyah Trice/Extender: Allen Derry ?Weeks in Treatment: 3 ?Visit Information History Since Last Visit ?All ordered tests and consults were completed: No ?Patient Arrived: Ambulatory ?Added or deleted any medications: No ?Arrival Time: 14:04 ?Any new allergies or adverse reactions: No ?Accompanied By: self ?Had a fall or experienced change in No ?Transfer Assistance: None ?activities of daily living that may affect ?Patient Identification Verified: Yes ?risk of falls: ?Secondary Verification Process Completed: Yes ?Signs or symptoms of abuse/neglect since last visito No ?Patient Requires Transmission-Based Precautions: No ?Hospitalized since last visit: No ?Patient Has Alerts: No ?Implantable device outside of the clinic excluding No ?cellular tissue based products placed in the center ?since last visit: ?Has Dressing in Place as Prescribed: Yes ?Pain Present Now: No ?Electronic Signature(s) ?Signed: 05/10/2021 4:35:36 PM By: Yevonne Pax RN ?Entered By: Yevonne Pax on 05/10/2021 14:08:03 ?AUSTINE, KELSAY ([id number]) ?-------------------------------------------------------------------------------- ?Clinic Level of Care Assessment Details ?Patient Name: Pam, Mathews ?Date of Service: 05/10/2021 2:00 PM ?Medical Record Number: [id number] ?Patient Account Number: [id number] ?Date of Birth/Sex: Jan 14, 1946 (76 y.o. F) ?Treating RN: Yevonne Pax ?Primary Care Marquite Attwood: Delbert Harness Other Clinician: ?Referring Tiyona Desouza: Delbert Harness ?Treating Codylee Patil/Extender: Allen Derry ?Weeks in Treatment: 3 ?Clinic Level of Care Assessment Items ?TOOL 4  Quantity Score ?X - Use when only an EandM is performed on FOLLOW-UP visit 1 0 ?ASSESSMENTS - Nursing Assessment / Reassessment ?X - Reassessment of Co-morbidities (includes updates in patient status) 1 10 ?X- 1 5 ?Reassessment of Adherence to Treatment Plan ?ASSESSMENTS - Wound and Skin Assessment / Reassessment ?X - Simple Wound Assessment / Reassessment - one wound 1 5 ?[]  - 0 ?Complex Wound Assessment / Reassessment - multiple wounds ?[]  - 0 ?Dermatologic / Skin Assessment (not related to wound area) ?ASSESSMENTS - Focused Assessment ?[]  - Circumferential Edema Measurements - multi extremities 0 ?[]  - 0 ?Nutritional Assessment / Counseling / Intervention ?[]  - 0 ?Lower Extremity Assessment (monofilament, tuning fork, pulses) ?[]  - 0 ?Peripheral Arterial Disease Assessment (using hand held doppler) ?ASSESSMENTS - Ostomy and/or Continence Assessment and Care ?[]  - Incontinence Assessment and Management 0 ?[]  - 0 ?Ostomy Care Assessment and Management (repouching, etc.) ?PROCESS - Coordination of Care ?X - Simple Patient / Family Education for ongoing care 1 15 ?[]  - 0 ?Complex (extensive) Patient / Family Education for ongoing care ?[]  - 0 ?Staff obtains Consents, Records, Test Results / Process Orders ?[]  - 0 ?Staff telephones HHA, Nursing Homes / Clarify orders / etc ?[]  - 0 ?Routine Transfer to another Facility (non-emergent condition) ?[]  - 0 ?Routine Hospital Admission (non-emergent condition) ?[]  - 0 ?New Admissions / / Ordering NPWT, Apligraf, etc. ?[]  - 0 ?Emergency Hospital Admission (emergent condition) ?X- 1 10 ?Simple Discharge Coordination ?[]  - 0 ?Complex (extensive) Discharge Coordination ?PROCESS - Special Needs ?[]  - Pediatric / Minor Patient Management 0 ?[]  - 0 ?Isolation Patient Management ?[]  - 0 ?Hearing / Language / Visual special needs ?[]  - 0 ?Assessment of Community assistance (transportation, D/C planning, etc.) ?[]  - 0 ?Additional assistance / Altered  mentation ?[]  - 0 ?Support Surface(s) Assessment (bed, cushion, seat, etc.) ?INTERVENTIONS - Wound Cleansing / Measurement ?YAZMINA, PAREJA ( ) ?X- 1 5 ?Simple Wound Cleansing -  one wound ?[]  - 0 ?Complex Wound Cleansing - multiple wounds ?X- 1 5 ?Wound Imaging (photographs - any number of wounds) ?[]  - 0 ?Wound Tracing (instead of photographs) ?X- 1 5 ?Simple Wound Measurement - one wound ?[]  - 0 ?Complex Wound Measurement - multiple wounds ?INTERVENTIONS - Wound Dressings ?X - Small Wound Dressing one or multiple wounds 1 10 ?[]  - 0 ?Medium Wound Dressing one or multiple wounds ?[]  - 0 ?Large Wound Dressing one or multiple wounds ?[]  - 0 ?Application of Medications - topical ?[]  - 0 ?Application of Medications - injection ?INTERVENTIONS - Miscellaneous ?[]  - External ear exam 0 ?[]  - 0 ?Specimen Collection (cultures, biopsies, blood, body fluids, etc.) ?[]  - 0 ?Specimen(s) / Culture(s) sent or taken to Lab for analysis ?[]  - 0 ?Patient Transfer (multiple staff / / Similar devices) ?[]  - 0 ?Simple Staple / Suture removal (25 or less) ?[]  - 0 ?Complex Staple / Suture removal (26 or more) ?[]  - 0 ?Hypo / Hyperglycemic Management (close monitor of Blood Glucose) ?[]  - 0 ?Ankle / Brachial Index (ABI) - do not check if billed separately ?X- 1 5 ?Vital Signs ?Has the patient been seen at the hospital within the last three years: Yes ?Total Score: 75 ?Level Of Care: New/Established - Level ?2 ?Electronic Signature(s) ?Signed: 05/10/2021 4:35:36 PM By: RN ?Entered By: on 05/10/2021 14:29:36 ?DAMONIQUE, BRUNELLE ( ) ?-------------------------------------------------------------------------------- ?Encounter Discharge Information Details ?Patient Name: Pam, Mathews ?Date of Service: 05/10/2021 2:00 PM ?Medical Record Number: ?Patient Account Number: ?Date of Birth/Sex: March 05, 1945 (76 y.o. F) ?Treating RN: ?Primary Care Bernese Doffing:  Other Clinician: ?Referring Aldrich Lloyd: ?Treating Ozias Dicenzo/Extender: 05/12/2021 ?Weeks in Treatment: 3 ?Encounter Discharge Information Items ?Discharge Condition: Stable ?Ambulatory Status: Ambulatory ?Discharge Destination: Home ?Transportation: Private Auto ?Accompanied By: self ?Schedule Follow-up Appointment: Yes ?Clinical Summary of Care: Patient Declined ?Electronic Signature(s) ?Signed: 05/10/2021 4:35:36 PM By: Yevonne Pax RN ?Entered By: 05/12/2021 on 05/10/2021 14:30:32 ?BATYA, CITRON (Carmina Miller) ?-------------------------------------------------------------------------------- ?Lower Extremity Assessment Details ?Patient Name: ZARIE, KOSIBA ?Date of Service: 05/10/2021 2:00 PM ?Medical Record Number: [id number] ?Patient Account Number: 06/21/1945 ?Date of Birth/Sex: 11-18-1945 (76 y.o. F) ?Treating RN: Delbert Harness ?Primary Care Ena Demary: Delbert Harness Other Clinician: ?Referring Johanna Matto: Allen Derry ?Treating Ismar Yabut/Extender: 05/12/2021 ?Weeks in Treatment: 3 ?Electronic Signature(s) ?Signed: 05/10/2021 4:35:36 PM By: Yevonne Pax RN ?Entered By: 05/12/2021 on 05/10/2021 14:27:33 ?WHITLEIGH, GARRAMONE (Carmina Miller) ?-------------------------------------------------------------------------------- ?Multi Wound Chart Details ?Patient Name: KENNADI, ALBANY ?Date of Service: 05/10/2021 2:00 PM ?Medical Record Number: [id number] ?Patient Account Number: 06/21/1945 ?Date of Birth/Sex: [birth year] (76 y.o. F) ?Treating RN: Delbert Harness ?Primary Care Kohei Antonellis: Delbert Harness Other Clinician: ?Referring Aprille Sawhney: Allen Derry ?Treating Caralyn Twining/Extender: 05/12/2021 ?Weeks in Treatment: 3 ?Vital Signs ?Height(in): 60 ?Pulse(bpm): 89 ?Weight(lbs): 112 ?Blood Pressure(mmHg): 160/78 ?Body Mass Index(BMI): 21.9 ?Temperature(??F): 98.9 ?Respiratory Rate(breaths/min): 16 ?Photos: [N/A:N/A] ?Wound Location: Right Lower Leg N/A N/A ?Wounding Event: Surgical Injury N/A N/A ?Primary Etiology: Dehisced Wound N/A  N/A ?Date Acquired: 03/25/2021 N/A N/A ?Weeks of Treatment: 3 N/A N/A ?Wound Status: Open N/A N/A ?Wound Recurrence: No N/A N/A ?Measurements L x W x D (cm) 0x0x0 N/A N/A ?Area (cm?) : 0 N/A N/A ?Volume (cm?) : 0 N/A N/A

## 2021-05-17 ENCOUNTER — Encounter: Payer: Medicare Other | Admitting: Physician Assistant

## 2021-05-24 ENCOUNTER — Encounter: Payer: Medicare Other | Admitting: Physician Assistant

## 2021-05-26 ENCOUNTER — Other Ambulatory Visit: Payer: Self-pay | Admitting: Orthopaedic Surgery

## 2021-05-26 DIAGNOSIS — M79671 Pain in right foot: Secondary | ICD-10-CM

## 2021-05-31 ENCOUNTER — Encounter: Payer: Medicare Other | Admitting: Physician Assistant

## 2021-06-01 ENCOUNTER — Ambulatory Visit
Admission: RE | Admit: 2021-06-01 | Discharge: 2021-06-01 | Disposition: A | Discharge status: Home / Self Care | Payer: Medicare Other | Source: Ambulatory Visit | Attending: Orthopaedic Surgery | Admitting: Orthopaedic Surgery

## 2021-06-01 DIAGNOSIS — M79671 Pain in right foot: Secondary | ICD-10-CM

## 2021-06-07 ENCOUNTER — Encounter: Payer: Medicare Other | Admitting: Physician Assistant

## 2021-06-14 ENCOUNTER — Encounter: Payer: Medicare Other | Admitting: Physician Assistant

## 2022-08-24 IMAGING — CT CT FOOT*R* W/O CM
2 series · 10 of 14 positions shown, 12 images · non-contrast
Comparison: 09/29/2019

CLINICAL DATA: Fracture of the first proximal phalanx.

EXAM:
CT OF THE RIGHT FOOT WITHOUT CONTRAST
TECHNIQUE: Multidetector CT imaging of the right foot was performed according
to the standard protocol. Multiplanar CT image reconstructions were
also generated.
RADIATION DOSE REDUCTION: This exam was performed according to the
departmental dose-optimization program which includes automated
exposure control, adjustment of the mA and/or kV according to
patient size and/or use of iterative reconstruction technique.

[Series 3: bone lower extremity · axial · 0.59mm/px · z∈[+28,+152]mm · 6 of 88 slices shown, 8 images]
[im 13/88  soft-tissue]
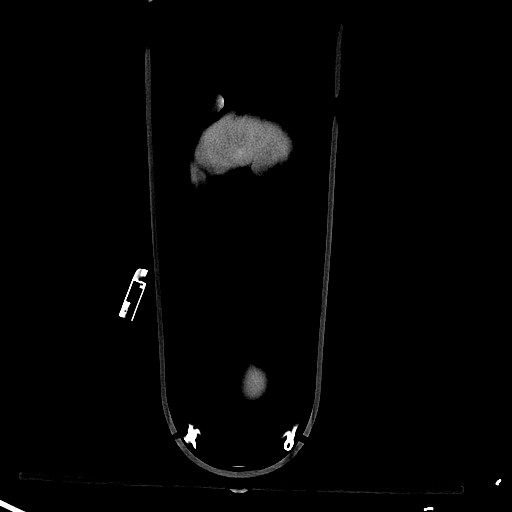
[im 13/88  bone]
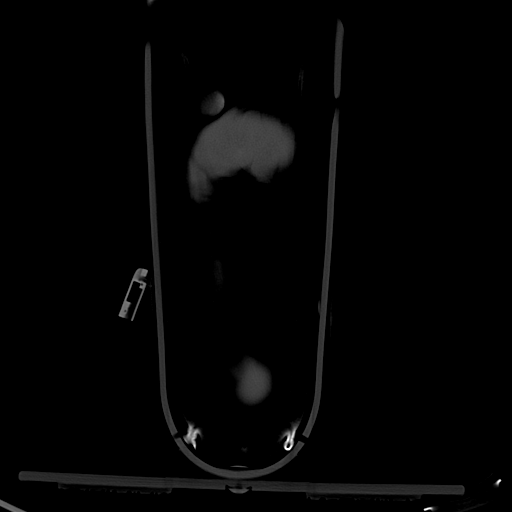
[im 25/88  bone]
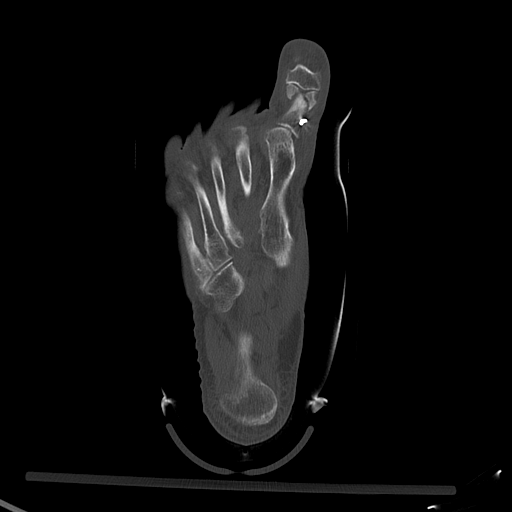
[im 38/88  bone]
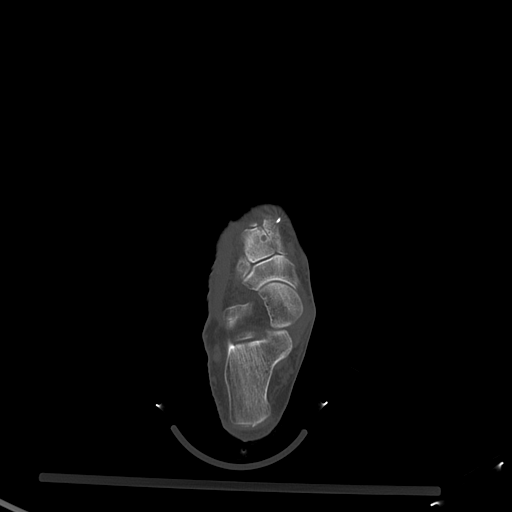
[im 50/88  bone]
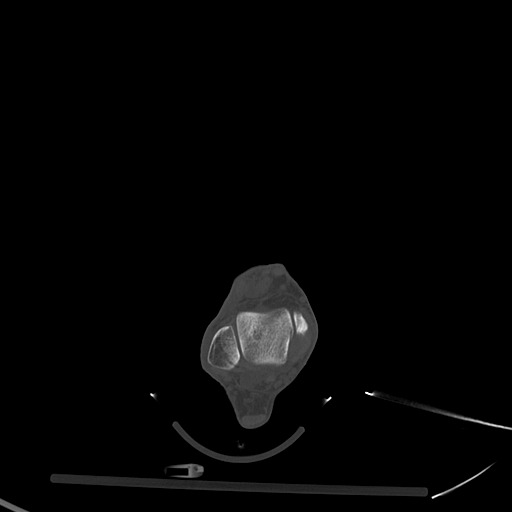
[im 63/88  soft-tissue]
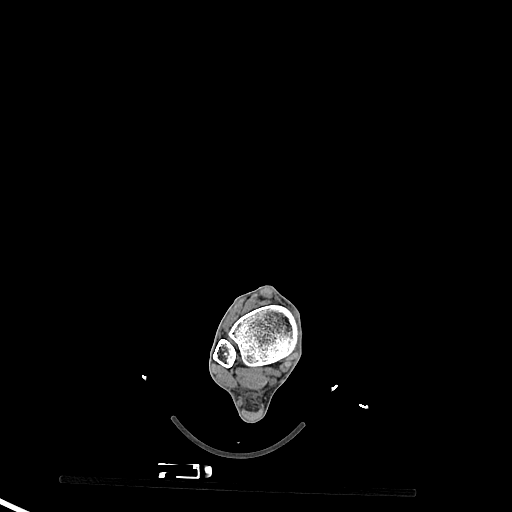
[im 63/88  bone]
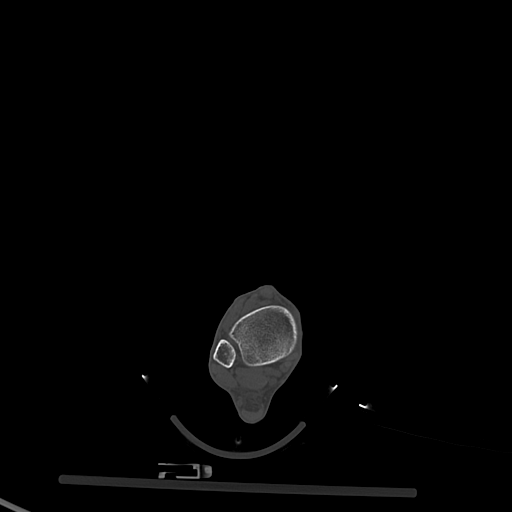
[im 75/88  bone]
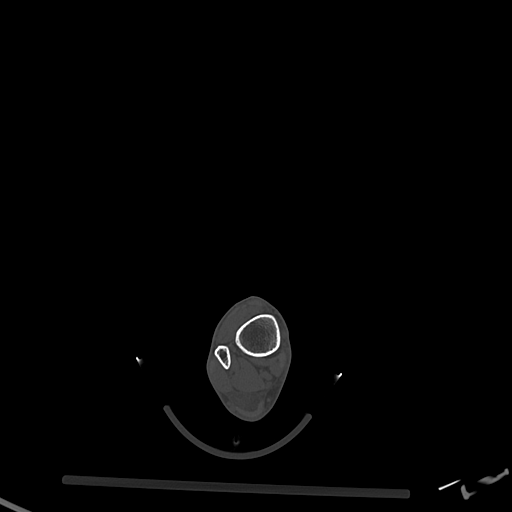

[Series 4: soft tissue lower extremity · axial · 0.24mm/px · z∈[+3,+97]mm · 4 of 76 slices shown]
[im 16/76  soft-tissue]
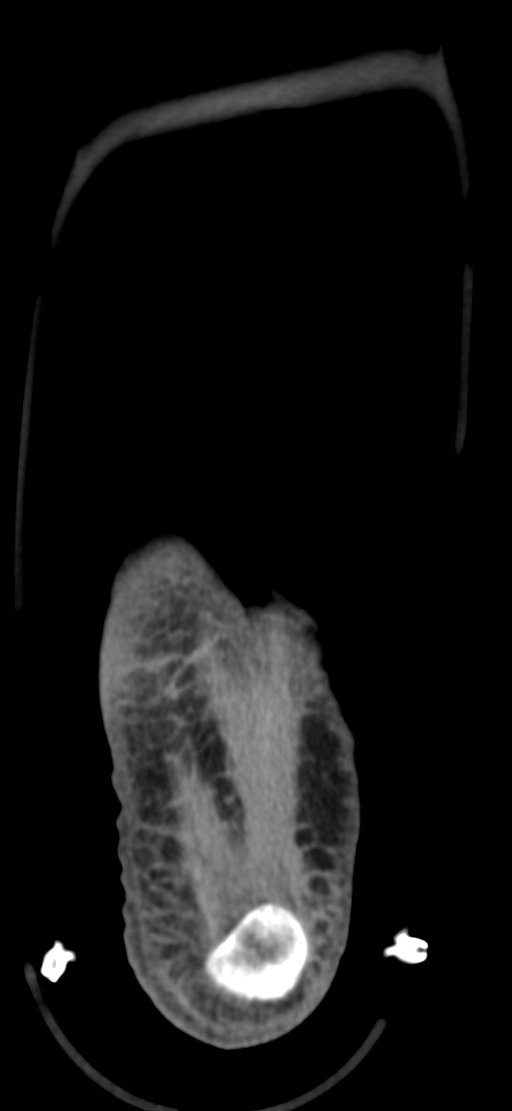
[im 31/76  soft-tissue]
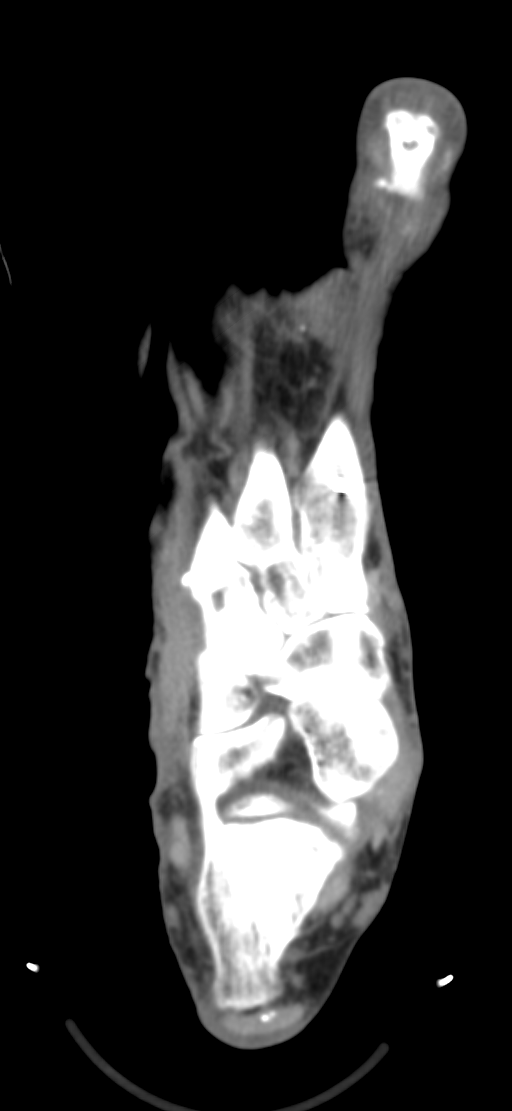
[im 46/76  soft-tissue]
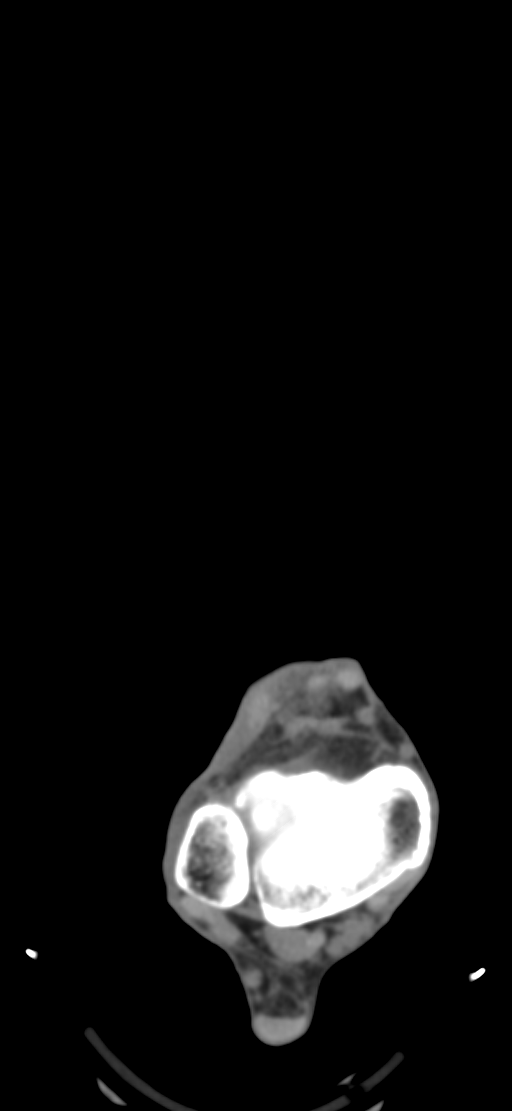
[im 61/76  soft-tissue]
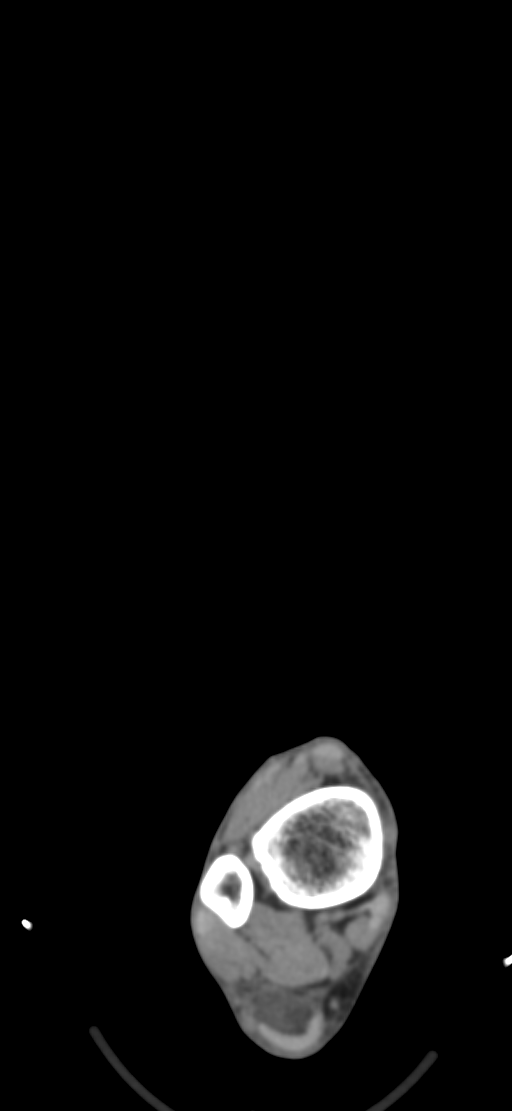

[10 of 14 positions shown; findings below may reference images not displayed]

FINDINGS: Bones/Joint/Cartilage

Comminuted fracture of the head of the first proximal phalanx
involving the articular surface.

No other acute fracture or dislocation.

Prior hallux valgus repair. Single screw in the proximal-mid first
proximal phalanx. Healed first metatarsal osteotomy. Solid
arthrodesis across the first TMT joint. Severe osteoarthritis of the
third tarsometatarsal joint. Mild osteoarthritis of the fourth
tarsometatarsal joint. Hallux valgus with Mild-moderate
osteoarthritis of the first MTP joint. No joint effusion.

Relative pes planus. 2 mm osteochondral lesion involving the mid
talar dome. Subtalar joints are normal. Arthritic changes of the
medial hallux sesamoid-metatarsal joint.

Ligaments

Ligaments are suboptimally evaluated by CT.

Muscles and Tendons
Muscles are normal. No muscle atrophy. No intramuscular fluid
collection or hematoma. Flexor, extensor, peroneal and Achilles
tendons are intact.

Soft tissue
No fluid collection or hematoma.  No soft tissue mass.
IMPRESSION: 1. Comminuted fracture of the head of the first proximal phalanx
involving the articular surface.
2. Prior hallux valgus repair with solid arthrodesis across the
first TMT joint.
3. Healed first metatarsal osteotomy.
4. Severe osteoarthritis of the third tarsometatarsal joint.
5. Mild osteoarthritis of the fourth tarsometatarsal joint.
6. Hallux valgus with Mild-moderate osteoarthritis of the first MTP
joint.
7. A 2 mm osteochondral lesion involving the mid talar dome.
